# Patient Record
Sex: Female | Born: 1970 | Race: Black or African American | Hispanic: No | Marital: Married | Smoking: Former smoker
Health system: Southern US, Community
[De-identification: ages and names within clinical notes are randomized; demographics above are authoritative.]

## PROBLEM LIST (undated history)

## (undated) DIAGNOSIS — R519 Headache, unspecified: Secondary | ICD-10-CM

## (undated) DIAGNOSIS — I10 Essential (primary) hypertension: Secondary | ICD-10-CM

## (undated) DIAGNOSIS — J45909 Unspecified asthma, uncomplicated: Secondary | ICD-10-CM

## (undated) HISTORY — PX: MOUTH SURGERY: SHX715

## (undated) HISTORY — PX: COLONOSCOPY: SHX174

---

## 1998-03-14 ENCOUNTER — Other Ambulatory Visit: Admission: RE | Admit: 1998-03-14 | Discharge: 1998-03-14 | Payer: Self-pay | Admitting: Obstetrics

## 2000-04-23 ENCOUNTER — Encounter: Admission: RE | Admit: 2000-04-23 | Discharge: 2000-04-23 | Payer: Self-pay | Admitting: Family Medicine

## 2000-04-23 ENCOUNTER — Encounter: Payer: Self-pay | Admitting: Family Medicine

## 2001-11-16 ENCOUNTER — Inpatient Hospital Stay (HOSPITAL_COMMUNITY): Admission: AD | Admit: 2001-11-16 | Discharge: 2001-11-16 | Payer: Self-pay | Admitting: Obstetrics and Gynecology

## 2005-01-29 ENCOUNTER — Other Ambulatory Visit: Admission: RE | Admit: 2005-01-29 | Discharge: 2005-01-29 | Payer: Self-pay | Admitting: Obstetrics and Gynecology

## 2008-04-05 ENCOUNTER — Other Ambulatory Visit: Admission: RE | Admit: 2008-04-05 | Discharge: 2008-04-05 | Payer: Self-pay | Admitting: Family Medicine

## 2011-01-01 ENCOUNTER — Other Ambulatory Visit (HOSPITAL_COMMUNITY)
Admission: RE | Admit: 2011-01-01 | Discharge: 2011-01-01 | Disposition: A | Discharge status: Home / Self Care | Payer: 59 | Source: Ambulatory Visit | Attending: Family Medicine | Admitting: Family Medicine

## 2011-01-01 ENCOUNTER — Other Ambulatory Visit: Payer: Self-pay | Admitting: Family Medicine

## 2011-01-01 DIAGNOSIS — Z124 Encounter for screening for malignant neoplasm of cervix: Secondary | ICD-10-CM | POA: Insufficient documentation

## 2011-11-28 ENCOUNTER — Emergency Department (HOSPITAL_COMMUNITY)
Admission: EM | Admit: 2011-11-28 | Discharge: 2011-11-28 | Disposition: A | Discharge status: Home / Self Care | Payer: 59 | Attending: Emergency Medicine | Admitting: Emergency Medicine

## 2011-11-28 ENCOUNTER — Encounter (HOSPITAL_COMMUNITY): Payer: Self-pay | Admitting: Physical Medicine and Rehabilitation

## 2011-11-28 ENCOUNTER — Other Ambulatory Visit: Payer: Self-pay

## 2011-11-28 DIAGNOSIS — R0602 Shortness of breath: Secondary | ICD-10-CM | POA: Insufficient documentation

## 2011-11-28 DIAGNOSIS — R091 Pleurisy: Secondary | ICD-10-CM

## 2011-11-28 DIAGNOSIS — Z87891 Personal history of nicotine dependence: Secondary | ICD-10-CM | POA: Insufficient documentation

## 2011-11-28 LAB — COMPREHENSIVE METABOLIC PANEL
ALT: 7 U/L (ref 0–35)
AST: 17 U/L (ref 0–37)
Albumin: 3.9 g/dL (ref 3.5–5.2)
Chloride: 102 mEq/L (ref 96–112)
Creatinine, Ser: 0.86 mg/dL (ref 0.50–1.10)
Potassium: 3.7 mEq/L (ref 3.5–5.1)
Sodium: 137 mEq/L (ref 135–145)
Total Bilirubin: 0.3 mg/dL (ref 0.3–1.2)

## 2011-11-28 LAB — CBC WITH DIFFERENTIAL/PLATELET
Basophils Absolute: 0 10*3/uL (ref 0.0–0.1)
Basophils Relative: 1 % (ref 0–1)
Eosinophils Absolute: 0.1 10*3/uL (ref 0.0–0.7)
Eosinophils Relative: 2 % (ref 0–5)
HCT: 39.8 % (ref 36.0–46.0)
Hemoglobin: 13.5 g/dL (ref 12.0–15.0)
Lymphocytes Relative: 53 % — ABNORMAL HIGH (ref 12–46)
Lymphs Abs: 2.1 10*3/uL (ref 0.7–4.0)
MCH: 29.2 pg (ref 26.0–34.0)
MCHC: 33.9 g/dL (ref 30.0–36.0)
MCV: 86 fL (ref 78.0–100.0)
Monocytes Absolute: 0.3 10*3/uL (ref 0.1–1.0)
Monocytes Relative: 8 % (ref 3–12)
Neutro Abs: 1.4 10*3/uL — ABNORMAL LOW (ref 1.7–7.7)
Neutrophils Relative %: 37 % — ABNORMAL LOW (ref 43–77)
Platelets: 226 10*3/uL (ref 150–400)
RBC: 4.63 MIL/uL (ref 3.87–5.11)
RDW: 13.2 % (ref 11.5–15.5)
WBC: 4 10*3/uL (ref 4.0–10.5)

## 2011-11-28 LAB — POCT I-STAT TROPONIN I

## 2011-11-28 MED ORDER — HYDROCODONE-ACETAMINOPHEN 5-325 MG PO TABS: 1.0000 | ORAL_TABLET | Freq: Four times a day (QID) | ORAL | Status: DC | PRN

## 2011-11-28 MED ORDER — OMEPRAZOLE 20 MG PO CPDR: 40.0000 mg | DELAYED_RELEASE_CAPSULE | Freq: Every day | ORAL | Status: DC

## 2011-11-28 MED ORDER — ASPIRIN 81 MG PO CHEW
324.0000 mg | CHEWABLE_TABLET | Freq: Once | ORAL | Status: AC
  Administered 2011-11-28: 324 mg via ORAL
  Filled 2011-11-28: qty 4

## 2011-11-28 NOTE — ED Notes (Signed)
Pt presents to department from [facility] PCP for evaluation of L sided chest pain radiating to shoulder and back. 7/10 constant pain at the time. Also states SOB. Pt is conscious alert and oriented x4. Respirations unlabored. Skin warm and dry. No signs of acute distress at present.

## 2011-11-28 NOTE — ED Notes (Signed)
Patient resting with NAD at this time.  

## 2011-11-28 NOTE — ED Notes (Signed)
Pt c/o 9/10 cp over left breast sharp and dull intermittently radiating to back left shoulder blade. Pt states she is sob, weak. Denies blurred vision nor dizziness. Pt A&Ox4, ambulatory.

## 2011-11-28 NOTE — ED Provider Notes (Signed)
History     CSN: [id number] Arrival date & time 11/28/11  1242 First MD Initiated Contact with Patient 11/28/11 1337      Chief Complaint  Patient presents with  . Chest Pain  . Shortness of Breath    Patient is a 41 y.o. female presenting with chest pain and shortness of breath. The history is provided by the patient.  Chest Pain The chest pain began 6 - 12 hours ago (started at 8 am, left side). Chest pain occurs constantly (waxes and wanes). The chest pain is worsening. The pain is associated with breathing. The severity of the pain is moderate. The quality of the pain is described as sharp and dull. The pain radiates to the upper back. Chest pain is worsened by deep breathing. Primary symptoms include shortness of breath. Pertinent negatives for primary symptoms include no cough, no nausea and no vomiting.  Pertinent negatives for past medical history include no DVT, no MI and no PE.  Pertinent negatives for family medical history include: no CAD in family, no early MI in family and no PE in family.    Shortness of Breath  Associated symptoms include chest pain and shortness of breath. Pertinent negatives include no cough.  No recent trips or travel.  She does not feel short of breath at rest. She has tried antacids without relief.  No recent uri.  PCP saw her in the office and pt had an xray and EKG.  Sent to the ED for lab testing, further workup.  No past medical history on file.  No past surgical history on file.  No family history on file.  History  Substance Use Topics  . Smoking status: Former Smoker    Types: Cigarettes  . Smokeless tobacco: Not on file  . Alcohol Use: No    OB History    Grav Para Term Preterm Abortions TAB SAB Ect Mult Living                  Review of Systems  Respiratory: Positive for shortness of breath. Negative for cough.   Cardiovascular: Positive for chest pain.  Gastrointestinal: Negative for nausea and vomiting.  All other  systems reviewed and are negative.    Allergies  Review of patient's allergies indicates no known allergies.  Home Medications   Current Outpatient Rx  Name Route Sig Dispense Refill  . EUCERIN EX CREA Topical Apply 1 application topically as needed. For eczema      BP 162/98  Pulse 70  Temp 98.6 F (37 C) (Oral)  Resp 16  SpO2 100%  Physical Exam  Nursing note and vitals reviewed. Constitutional: She appears well-developed and well-nourished. No distress.  HENT:  Head: Normocephalic and atraumatic.  Right Ear: External ear normal.  Left Ear: External ear normal.  Eyes: Conjunctivae normal are normal. Right eye exhibits no discharge. Left eye exhibits no discharge. No scleral icterus.  Neck: Neck supple. No tracheal deviation present.  Cardiovascular: Normal rate, regular rhythm and intact distal pulses.   Pulmonary/Chest: Effort normal and breath sounds normal. No stridor. No respiratory distress. She has no wheezes. She has no rales.  Abdominal: Soft. Bowel sounds are normal. She exhibits no distension. There is no tenderness. There is no rebound and no guarding.  Musculoskeletal: She exhibits no edema and no tenderness.  Neurological: She is alert. She has normal strength. No sensory deficit. Cranial nerve deficit:  no gross defecits noted. She exhibits normal muscle tone. She displays no seizure  activity. Coordination normal.  Skin: Skin is warm and dry. No rash noted.  Psychiatric: She has a normal mood and affect.    ED Course  Procedures (including critical care time)  Rate: 69  Rhythm: normal sinus rhythm  QRS Axis: normal  Intervals: normal  ST/T Wave abnormalities: normal  Conduction Disutrbances:none  Narrative Interpretation: no acute st t changes  Old EKG Reviewed: none available  Labs Reviewed  CBC WITH DIFFERENTIAL - Abnormal; Notable for the following:    Neutrophils Relative 37 (*)     Neutro Abs 1.4 (*)     Lymphocytes Relative 53 (*)     All  other components within normal limits  COMPREHENSIVE METABOLIC PANEL - Abnormal; Notable for the following:    GFR calc non Af Amer 82 (*)     All other components within normal limits  POCT I-STAT TROPONIN I  D-DIMER, QUANTITATIVE   No results found.   1. Pleurisy       MDM  Patient's symptoms are atypical for acute coronary syndrome. Pain is sharp and pleuritic. She is low risk for pulmonary embolism by Wells criteria. Her d-dimer is negative. I doubt pulmonary embolism. Patient will be discharged home with treatment for pleurisy.        Celene Kras, MD 11/28/11 [id number]

## 2011-11-28 NOTE — ED Notes (Signed)
Pt states she had chest x-ray this morning, order cancelled.

## 2012-01-10 ENCOUNTER — Other Ambulatory Visit: Payer: Self-pay | Admitting: Obstetrics & Gynecology

## 2012-05-27 ENCOUNTER — Other Ambulatory Visit: Payer: Self-pay | Admitting: Obstetrics and Gynecology

## 2012-05-27 ENCOUNTER — Other Ambulatory Visit: Payer: Self-pay

## 2012-05-27 DIAGNOSIS — R928 Other abnormal and inconclusive findings on diagnostic imaging of breast: Secondary | ICD-10-CM

## 2012-06-04 ENCOUNTER — Other Ambulatory Visit: Payer: Self-pay | Admitting: Family Medicine

## 2012-06-04 DIAGNOSIS — R928 Other abnormal and inconclusive findings on diagnostic imaging of breast: Secondary | ICD-10-CM

## 2012-06-11 ENCOUNTER — Ambulatory Visit
Admission: RE | Admit: 2012-06-11 | Discharge: 2012-06-11 | Disposition: A | Discharge status: Home / Self Care | Payer: 59 | Source: Ambulatory Visit | Attending: Obstetrics and Gynecology | Admitting: Obstetrics and Gynecology

## 2012-06-11 ENCOUNTER — Ambulatory Visit
Admission: RE | Admit: 2012-06-11 | Discharge: 2012-06-11 | Disposition: A | Discharge status: Home / Self Care | Payer: 59 | Source: Ambulatory Visit | Attending: Family Medicine | Admitting: Family Medicine

## 2012-06-11 ENCOUNTER — Other Ambulatory Visit: Payer: Self-pay | Admitting: Family Medicine

## 2012-06-11 DIAGNOSIS — R928 Other abnormal and inconclusive findings on diagnostic imaging of breast: Secondary | ICD-10-CM

## 2013-02-01 ENCOUNTER — Other Ambulatory Visit: Payer: Self-pay | Admitting: Family Medicine

## 2013-02-01 ENCOUNTER — Other Ambulatory Visit (HOSPITAL_COMMUNITY)
Admission: RE | Admit: 2013-02-01 | Discharge: 2013-02-01 | Disposition: A | Discharge status: Home / Self Care | Payer: 59 | Source: Ambulatory Visit | Attending: Family Medicine | Admitting: Family Medicine

## 2013-02-01 DIAGNOSIS — Z01419 Encounter for gynecological examination (general) (routine) without abnormal findings: Secondary | ICD-10-CM | POA: Insufficient documentation

## 2013-05-11 ENCOUNTER — Other Ambulatory Visit: Payer: Self-pay

## 2013-05-11 DIAGNOSIS — Z1231 Encounter for screening mammogram for malignant neoplasm of breast: Secondary | ICD-10-CM

## 2013-06-18 ENCOUNTER — Ambulatory Visit
Admission: RE | Admit: 2013-06-18 | Discharge: 2013-06-18 | Disposition: A | Discharge status: Home / Self Care | Payer: 59 | Source: Ambulatory Visit

## 2013-06-18 ENCOUNTER — Encounter (INDEPENDENT_AMBULATORY_CARE_PROVIDER_SITE_OTHER): Payer: Self-pay

## 2013-06-18 DIAGNOSIS — Z1231 Encounter for screening mammogram for malignant neoplasm of breast: Secondary | ICD-10-CM

## 2014-08-02 ENCOUNTER — Other Ambulatory Visit: Payer: Self-pay

## 2014-08-02 ENCOUNTER — Other Ambulatory Visit: Payer: Self-pay | Admitting: Obstetrics and Gynecology

## 2014-08-02 DIAGNOSIS — Z1231 Encounter for screening mammogram for malignant neoplasm of breast: Secondary | ICD-10-CM

## 2014-08-09 ENCOUNTER — Ambulatory Visit
Admission: RE | Admit: 2014-08-09 | Discharge: 2014-08-09 | Disposition: A | Discharge status: Home / Self Care | Payer: 59 | Source: Ambulatory Visit

## 2014-08-09 DIAGNOSIS — Z1231 Encounter for screening mammogram for malignant neoplasm of breast: Secondary | ICD-10-CM

## 2015-09-13 ENCOUNTER — Other Ambulatory Visit: Payer: Self-pay | Admitting: Family Medicine

## 2015-09-13 DIAGNOSIS — Z1231 Encounter for screening mammogram for malignant neoplasm of breast: Secondary | ICD-10-CM

## 2015-09-21 ENCOUNTER — Ambulatory Visit
Admission: RE | Admit: 2015-09-21 | Discharge: 2015-09-21 | Disposition: A | Discharge status: Home / Self Care | Payer: 59 | Source: Ambulatory Visit | Attending: Family Medicine | Admitting: Family Medicine

## 2015-09-21 DIAGNOSIS — Z1231 Encounter for screening mammogram for malignant neoplasm of breast: Secondary | ICD-10-CM

## 2016-02-19 DIAGNOSIS — H109 Unspecified conjunctivitis: Secondary | ICD-10-CM | POA: Diagnosis not present

## 2016-02-19 DIAGNOSIS — M7711 Lateral epicondylitis, right elbow: Secondary | ICD-10-CM | POA: Diagnosis not present

## 2016-04-22 DIAGNOSIS — R21 Rash and other nonspecific skin eruption: Secondary | ICD-10-CM | POA: Diagnosis not present

## 2016-12-09 DIAGNOSIS — L309 Dermatitis, unspecified: Secondary | ICD-10-CM | POA: Diagnosis not present

## 2016-12-09 DIAGNOSIS — J310 Chronic rhinitis: Secondary | ICD-10-CM | POA: Diagnosis not present

## 2016-12-25 ENCOUNTER — Other Ambulatory Visit: Payer: Self-pay | Admitting: Family Medicine

## 2016-12-25 ENCOUNTER — Other Ambulatory Visit (HOSPITAL_COMMUNITY)
Admission: RE | Admit: 2016-12-25 | Discharge: 2016-12-25 | Disposition: A | Discharge status: Home / Self Care | Payer: 59 | Source: Ambulatory Visit | Attending: Family Medicine | Admitting: Family Medicine

## 2016-12-25 DIAGNOSIS — Z0000 Encounter for general adult medical examination without abnormal findings: Secondary | ICD-10-CM | POA: Diagnosis not present

## 2016-12-25 DIAGNOSIS — E559 Vitamin D deficiency, unspecified: Secondary | ICD-10-CM | POA: Diagnosis not present

## 2016-12-25 DIAGNOSIS — Z124 Encounter for screening for malignant neoplasm of cervix: Secondary | ICD-10-CM | POA: Diagnosis not present

## 2016-12-27 LAB — CYTOLOGY - PAP
ADEQUACY: ABSENT
DIAGNOSIS: NEGATIVE
HPV (WINDOPATH): NOT DETECTED

## 2017-03-01 DIAGNOSIS — J209 Acute bronchitis, unspecified: Secondary | ICD-10-CM | POA: Diagnosis not present

## 2017-05-12 DIAGNOSIS — M79672 Pain in left foot: Secondary | ICD-10-CM | POA: Diagnosis not present

## 2017-09-15 DIAGNOSIS — M79642 Pain in left hand: Secondary | ICD-10-CM | POA: Diagnosis not present

## 2017-09-15 DIAGNOSIS — M546 Pain in thoracic spine: Secondary | ICD-10-CM | POA: Diagnosis not present

## 2017-12-29 DIAGNOSIS — Z0000 Encounter for general adult medical examination without abnormal findings: Secondary | ICD-10-CM | POA: Diagnosis not present

## 2018-02-20 ENCOUNTER — Other Ambulatory Visit: Payer: Self-pay | Admitting: Family Medicine

## 2018-02-20 DIAGNOSIS — Z1231 Encounter for screening mammogram for malignant neoplasm of breast: Secondary | ICD-10-CM

## 2018-03-23 ENCOUNTER — Ambulatory Visit
Admission: RE | Admit: 2018-03-23 | Discharge: 2018-03-23 | Disposition: A | Discharge status: Home / Self Care | Payer: PRIVATE HEALTH INSURANCE | Source: Ambulatory Visit | Attending: Family Medicine | Admitting: Family Medicine

## 2018-03-23 DIAGNOSIS — Z1231 Encounter for screening mammogram for malignant neoplasm of breast: Secondary | ICD-10-CM

## 2020-02-06 ENCOUNTER — Other Ambulatory Visit: Payer: PRIVATE HEALTH INSURANCE

## 2020-02-06 ENCOUNTER — Other Ambulatory Visit: Payer: Self-pay

## 2020-02-06 DIAGNOSIS — Z20822 Contact with and (suspected) exposure to covid-19: Secondary | ICD-10-CM

## 2020-02-08 LAB — SARS-COV-2, NAA 2 DAY TAT

## 2020-02-08 LAB — NOVEL CORONAVIRUS, NAA: SARS-CoV-2, NAA: DETECTED — AB

## 2020-03-08 ENCOUNTER — Other Ambulatory Visit: Payer: Self-pay | Admitting: Family Medicine

## 2020-03-08 DIAGNOSIS — Z1231 Encounter for screening mammogram for malignant neoplasm of breast: Secondary | ICD-10-CM

## 2020-04-27 ENCOUNTER — Other Ambulatory Visit: Payer: Self-pay

## 2020-04-27 ENCOUNTER — Ambulatory Visit
Admission: RE | Admit: 2020-04-27 | Discharge: 2020-04-27 | Disposition: A | Discharge status: Home / Self Care | Payer: PRIVATE HEALTH INSURANCE | Source: Ambulatory Visit | Attending: Family Medicine | Admitting: Family Medicine

## 2020-04-27 DIAGNOSIS — Z1231 Encounter for screening mammogram for malignant neoplasm of breast: Secondary | ICD-10-CM

## 2021-01-19 ENCOUNTER — Other Ambulatory Visit: Payer: Self-pay

## 2021-01-19 ENCOUNTER — Emergency Department (HOSPITAL_COMMUNITY): Payer: PRIVATE HEALTH INSURANCE

## 2021-01-19 ENCOUNTER — Emergency Department (HOSPITAL_COMMUNITY)
Admission: EM | Admit: 2021-01-19 | Discharge: 2021-01-19 | Disposition: A | Discharge status: Home / Self Care | Payer: PRIVATE HEALTH INSURANCE | Attending: Emergency Medicine | Admitting: Emergency Medicine

## 2021-01-19 DIAGNOSIS — M25562 Pain in left knee: Secondary | ICD-10-CM | POA: Insufficient documentation

## 2021-01-19 DIAGNOSIS — M25561 Pain in right knee: Secondary | ICD-10-CM | POA: Insufficient documentation

## 2021-01-19 DIAGNOSIS — Z5321 Procedure and treatment not carried out due to patient leaving prior to being seen by health care provider: Secondary | ICD-10-CM | POA: Insufficient documentation

## 2021-01-19 DIAGNOSIS — R0789 Other chest pain: Secondary | ICD-10-CM | POA: Diagnosis not present
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
## 2021-01-20 ENCOUNTER — Emergency Department (HOSPITAL_BASED_OUTPATIENT_CLINIC_OR_DEPARTMENT_OTHER)
Admission: EM | Admit: 2021-01-20 | Discharge: 2021-01-20 | Disposition: A | Discharge status: Home / Self Care | Payer: PRIVATE HEALTH INSURANCE | Attending: Emergency Medicine | Admitting: Emergency Medicine

## 2021-01-20 ENCOUNTER — Encounter (HOSPITAL_BASED_OUTPATIENT_CLINIC_OR_DEPARTMENT_OTHER): Payer: Self-pay | Admitting: Emergency Medicine

## 2021-01-20 DIAGNOSIS — M542 Cervicalgia: Secondary | ICD-10-CM | POA: Diagnosis not present

## 2021-01-20 DIAGNOSIS — Z87891 Personal history of nicotine dependence: Secondary | ICD-10-CM | POA: Insufficient documentation

## 2021-01-20 DIAGNOSIS — M545 Low back pain, unspecified: Secondary | ICD-10-CM | POA: Insufficient documentation

## 2021-01-20 DIAGNOSIS — Z79899 Other long term (current) drug therapy: Secondary | ICD-10-CM | POA: Insufficient documentation

## 2021-01-20 DIAGNOSIS — Y92410 Unspecified street and highway as the place of occurrence of the external cause: Secondary | ICD-10-CM | POA: Diagnosis not present

## 2021-01-20 HISTORY — DX: Essential (primary) hypertension: I10

## 2021-01-20 MED ORDER — IBUPROFEN 800 MG PO TABS
800.0000 mg | ORAL_TABLET | Freq: Once | ORAL | Status: AC
  Administered 2021-01-20: 09:00:00 800 mg via ORAL
  Filled 2021-01-20: qty 1

## 2021-01-20 NOTE — ED Notes (Signed)
Patient verbalizes understanding of discharge instructions. Opportunity for questioning and answers were provided. Patient discharged from ED.  °
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
## 2021-04-17 ENCOUNTER — Other Ambulatory Visit: Payer: Self-pay | Admitting: Family Medicine

## 2021-04-17 DIAGNOSIS — Z1231 Encounter for screening mammogram for malignant neoplasm of breast: Secondary | ICD-10-CM

## 2021-05-04 ENCOUNTER — Ambulatory Visit
Admission: RE | Admit: 2021-05-04 | Discharge: 2021-05-04 | Disposition: A | Discharge status: Home / Self Care | Payer: PRIVATE HEALTH INSURANCE | Source: Ambulatory Visit | Attending: Family Medicine | Admitting: Family Medicine

## 2021-05-04 DIAGNOSIS — Z1231 Encounter for screening mammogram for malignant neoplasm of breast: Secondary | ICD-10-CM

## 2021-12-20 ENCOUNTER — Other Ambulatory Visit: Payer: Self-pay

## 2021-12-20 ENCOUNTER — Emergency Department (HOSPITAL_BASED_OUTPATIENT_CLINIC_OR_DEPARTMENT_OTHER): Payer: Managed Care, Other (non HMO)

## 2021-12-20 ENCOUNTER — Emergency Department (HOSPITAL_BASED_OUTPATIENT_CLINIC_OR_DEPARTMENT_OTHER)
Admission: EM | Admit: 2021-12-20 | Discharge: 2021-12-20 | Disposition: A | Discharge status: Home / Self Care | Payer: Managed Care, Other (non HMO) | Attending: Emergency Medicine | Admitting: Emergency Medicine

## 2021-12-20 ENCOUNTER — Encounter (HOSPITAL_BASED_OUTPATIENT_CLINIC_OR_DEPARTMENT_OTHER): Payer: Self-pay | Admitting: Emergency Medicine

## 2021-12-20 DIAGNOSIS — Y9289 Other specified places as the place of occurrence of the external cause: Secondary | ICD-10-CM | POA: Insufficient documentation

## 2021-12-20 DIAGNOSIS — M25571 Pain in right ankle and joints of right foot: Secondary | ICD-10-CM | POA: Diagnosis present

## 2021-12-20 DIAGNOSIS — X501XXA Overexertion from prolonged static or awkward postures, initial encounter: Secondary | ICD-10-CM | POA: Diagnosis not present

## 2021-12-20 DIAGNOSIS — S8264XA Nondisplaced fracture of lateral malleolus of right fibula, initial encounter for closed fracture: Secondary | ICD-10-CM | POA: Diagnosis not present

## 2021-12-20 MED ORDER — IBUPROFEN 800 MG PO TABS: 800.0000 mg | ORAL_TABLET | Freq: Three times a day (TID) | ORAL | 0 refills | Status: DC

## 2021-12-20 MED ORDER — OXYCODONE-ACETAMINOPHEN 5-325 MG PO TABS: 1.0000 | ORAL_TABLET | Freq: Four times a day (QID) | ORAL | 0 refills | Status: DC | PRN

## 2021-12-20 MED ORDER — IBUPROFEN 800 MG PO TABS
800.0000 mg | ORAL_TABLET | Freq: Once | ORAL | Status: AC
  Administered 2021-12-20: 800 mg via ORAL
  Filled 2021-12-20: qty 1

## 2021-12-20 NOTE — ED Notes (Signed)
Pt c/o pain 9/10 in R ankle. Pulses WNL. Skin intact. Ice applied.

## 2021-12-20 NOTE — Discharge Instructions (Signed)
You were seen in the emergency department today after a trip and fall.  You did fracture your ankle/fibula.  We placed you in a boot and crutches.  You are to be nonweightbearing until you follow-up with orthopedic.  I have also prescribed you some extra strength ibuprofen that you can use 3 times a day for pain relief.  When you are resting please elevate your right ankle and use ice multiple times a day for 15 to 20 minutes at a time.  I have referred you to the orthopedic at [facility].  Please call Monday or Tuesday to ensure your follow-up appointment.  Please return for significantly worsening pain.

## 2021-12-20 NOTE — ED Triage Notes (Signed)
Fell down steps, twisted right ankle, large lump on right ankle,painful.

## 2021-12-20 NOTE — ED Provider Notes (Signed)
[facility] EMERGENCY DEPT Provider Note   CSN: [id number] Arrival date & time: 12/20/21  4627     History  Chief Complaint  Patient presents with   Ankle Pain    Elizabeth Perry is a 51 y.o. female. With past medical history of hypertension who presents to the emergency department with fall.  States she was leaving her storage room that had a ramp when she slipped, rolling her right ankle and falling.  She denies striking her head or loss of consciousness.  She states immediately afterwards she had pain and swelling to the right ankle.  Denies other pain or injuries.  HPI     Home Medications Prior to Admission medications   Medication Sig Start Date End Date Taking? Authorizing Provider  ibuprofen (ADVIL) 800 MG tablet Take 1 tablet (800 mg total) by mouth 3 (three) times daily. 12/20/21  Yes Cristopher Peru, PA-C  amlodipine-atorvastatin (CADUET) 2.5-40 MG tablet Take 1 tablet by mouth daily.    [provider]  HYDROcodone-acetaminophen (NORCO) 5-325 MG per tablet Take 1-2 tablets by mouth every 6 (six) hours as needed for pain. 11/28/11   Linwood Dibbles, MD  metoprolol succinate (TOPROL-XL) 25 MG 24 hr tablet Take 25 mg by mouth daily.    [provider]  omeprazole (PRILOSEC) 20 MG capsule Take 2 capsules (40 mg total) by mouth daily. 11/28/11   Linwood Dibbles, MD  Skin Protectants, Misc. (EUCERIN) cream Apply 1 application topically as needed. For eczema    [provider]      Allergies    Patient has no known allergies.    Review of Systems   Review of Systems  Musculoskeletal:  Positive for arthralgias, gait problem and joint swelling.  All other systems reviewed and are negative.   Physical Exam Updated Vital Signs BP 122/79   Pulse 63   Temp 97.6 F (36.4 C) (Oral)   Resp 16   SpO2 100%  Physical Exam Vitals and nursing note reviewed.  HENT:     Head: Normocephalic and atraumatic.  Eyes:     General: No  scleral icterus. Pulmonary:     Effort: Pulmonary effort is normal. No respiratory distress.  Musculoskeletal:     Comments: Lateral right ankle with swelling, bruising, tenderness to palpation.  DP pulse 2+.  Decreased range of motion of the ankle joint to flexion and extension.  She is able to move all toes.  Sensation is intact.  Skin:    Findings: No rash.  Neurological:     General: No focal deficit present.     Mental Status: She is alert.  Psychiatric:        Mood and Affect: Mood normal.        Behavior: Behavior normal.        Thought Content: Thought content normal.        Judgment: Judgment normal.     ED Results / Procedures / Treatments   Labs (all labs ordered are listed, but only abnormal results are displayed) Labs Reviewed - No data to display  EKG None  Radiology DG Ankle Right Port  Result Date: 12/20/2021 CLINICAL DATA:  Ankle injury with pain. EXAM: PORTABLE RIGHT ANKLE - 2 VIEW COMPARISON:  None Available. FINDINGS: Two-view exam shows a comminuted distal fibula fracture at the level of the ankle mortise. There may be some widening of the lateral tibiotalar joint. Soft tissue swelling evident. IMPRESSION: Comminuted distal fibula fracture with possible widening of the lateral  tibiotalar joint. No associated fracture of the distal tibia evident on this two-view exam. Electronically Signed   By: Kennith Center M.D.   On: 12/20/2021 09:06    Procedures Procedures    Medications Ordered in ED Medications  ibuprofen (ADVIL) tablet 800 mg (800 mg Oral Given 12/20/21 3825)    ED Course/ Medical Decision Making/ A&P                           Medical Decision Making Amount and/or Complexity of Data Reviewed Radiology: ordered.  Risk Prescription drug management.  Initial Impression and Ddx 51 year old female who presents to the emergency department with right ankle pain.  Lateral right ankle swelling.  Neurovascularly intact Patient PMH that increases  complexity of ED encounter:  hypertension  Interpretation of Diagnostics I independent reviewed and interpreted the labs as followed: Not indicated  - I independently visualized the following imaging with scope of interpretation limited to determining acute life threatening conditions related to emergency care: Plain film of the right ankle, which revealed comminuted distal fibular fracture with possible widening of the lateral tibiotalar joint.  Patient Reassessment and Ultimate Disposition/Management Given ice and ibuprofen.  X-ray with fracture.  Placing in a cam boot with crutches to be nonweightbearing. Will discharge with orthopedic follow-up.  Instructed to be nonweightbearing until she is evaluated by Ortho.  Prescribed her extra strength ibuprofen for her to use.  Discussed RICE therapy.  Discussed return precautions.  She verbalized understanding.  Otherwise feel she is safe for discharge Patient management required discussion with the following services or consulting groups:  None  Complexity of Problems Addressed Acute complicated illness or Injury  Additional Data Reviewed and Analyzed Further history obtained from: Further history from spouse/family member  Patient Encounter Risk Assessment Prescriptions  Final Clinical Impression(s) / ED Diagnoses Final diagnoses:  Closed nondisplaced fracture of lateral malleolus of right fibula, initial encounter    Rx / DC Orders ED Discharge Orders          Ordered    ibuprofen (ADVIL) 800 MG tablet  3 times daily        12/20/21 0930    Ambulatory referral to Orthopedic Surgery        12/20/21 0931              Cristopher Peru, PA-C 12/20/21 0539    Derwood Kaplan, MD 12/20/21 1348

## 2021-12-20 NOTE — ED Notes (Signed)
Pt given discharge instructions and reviewed prescriptions. Opportunities given for questions. Pt verbalizes understanding. Ketara Cavness R, RN 

## 2021-12-24 ENCOUNTER — Ambulatory Visit (INDEPENDENT_AMBULATORY_CARE_PROVIDER_SITE_OTHER): Payer: Managed Care, Other (non HMO)

## 2021-12-24 ENCOUNTER — Ambulatory Visit (INDEPENDENT_AMBULATORY_CARE_PROVIDER_SITE_OTHER): Payer: Managed Care, Other (non HMO) | Admitting: Orthopedic Surgery

## 2021-12-24 ENCOUNTER — Encounter: Payer: Self-pay | Admitting: Orthopedic Surgery

## 2021-12-24 DIAGNOSIS — M25571 Pain in right ankle and joints of right foot: Secondary | ICD-10-CM | POA: Diagnosis not present

## 2021-12-24 DIAGNOSIS — S82891A Other fracture of right lower leg, initial encounter for closed fracture: Secondary | ICD-10-CM

## 2021-12-24 NOTE — Progress Notes (Signed)
Office Visit Note   Patient: Elizabeth Perry           Date of Birth: 04/01/1970           MRN: [id number] Visit Date: 12/24/2021              Requested by: Daisy Floro, MD [address] [place],  [place] [address] PCP: Daisy Floro, MD  Chief Complaint  Patient presents with   Right Ankle - Fracture      HPI: Patient is a 51 year old woman who sustained a Weber B right fibular fracture closed when she slipped in a storage unit on 12/20/2021.  Patient went to med center at [facility] radiographs were obtained she is placed in a fracture boot and is seen today for initial evaluation.  Assessment & Plan: Visit Diagnoses:  1. Pain in right ankle and joints of right foot   2. Closed fracture of right ankle, initial encounter     Plan: With the displaced ankle fracture have recommended proceeding with open reduction internal fixation.  Risk and benefits were discussed including infection neurovascular injury persistent pain DVT arthritis.  Patient states she understands wished to proceed at this time plan for outpatient surgery on Friday.  Follow-Up Instructions: Return in about 2 weeks (around 01/07/2022).   Ortho Exam  Patient is alert, oriented, no adenopathy, well-dressed, normal affect, normal respiratory effort. Examination patient has a palpable dorsalis pedis pulse.  The deltoid ligament is nontender to palpation the syndesmosis is not tender to palpation.  The proximal tibia and fibular are not tender to palpation.  Patient is tender to palpation laterally over the ankle.  Radiograph shows a displaced Weber B fibular fracture closed.  Imaging: XR Ankle Complete Right  Result Date: 12/24/2021 Three-view radiographs of the right ankle shows a displaced Weber B fibula fracture with lateral displacement of the talus and widened medial joint space.  No images are attached to the encounter.  Labs: No results found for: "HGBA1C", "ESRSEDRATE", "CRP",  "LABURIC", "REPTSTATUS", "GRAMSTAIN", "CULT", "LABORGA"   Lab Results  Component Value Date   ALBUMIN 3.9 11/28/2011    No results found for: "MG" No results found for: "VD25OH"  No results found for: "PREALBUMIN"    Latest Ref Rng & Units 11/28/2011   12:52 PM  CBC EXTENDED  WBC 4.0 - 10.5 K/uL 4.0   RBC 3.87 - 5.11 MIL/uL 4.63   Hemoglobin 12.0 - 15.0 g/dL 56.8   HCT 12.7 - 51.7 % 39.8   Platelets 150 - 400 K/uL 226   NEUT# 1.7 - 7.7 K/uL 1.4   Lymph# 0.7 - 4.0 K/uL 2.1      There is no height or weight on file to calculate BMI.  Orders:  Orders Placed This Encounter  Procedures   XR Ankle Complete Right   No orders of the defined types were placed in this encounter.    Procedures: No procedures performed  Clinical Data: No additional findings.  ROS:  All other systems negative, except as noted in the HPI. Review of Systems  Objective: Vital Signs: There were no vitals taken for this visit.  Specialty Comments:  No specialty comments available.  PMFS History: There are no problems to display for this patient.  Past Medical History:  Diagnosis Date   Hypertension     History reviewed. No pertinent family history.  History reviewed. No pertinent surgical history. Social History   Occupational History   Not on file  Tobacco  Use   Smoking status: Former    Types: Cigarettes   Smokeless tobacco: Not on file  Substance and Sexual Activity   Alcohol use: No   Drug use: No   Sexual activity: Not on file

## 2021-12-27 ENCOUNTER — Other Ambulatory Visit: Payer: Self-pay

## 2021-12-27 ENCOUNTER — Encounter (HOSPITAL_COMMUNITY): Payer: Self-pay | Admitting: Orthopedic Surgery

## 2021-12-27 NOTE — Progress Notes (Signed)
Elizabeth Perry denies chest pain or shortness of breath.  Patient denies having any s/s of Covid in her household, also denies any known exposure to Covid.   Elizabeth Perry's PCP is Dr. Miguel Aschoff.

## 2021-12-28 ENCOUNTER — Encounter (HOSPITAL_COMMUNITY): Payer: Self-pay | Admitting: Orthopedic Surgery

## 2021-12-28 ENCOUNTER — Ambulatory Visit (HOSPITAL_BASED_OUTPATIENT_CLINIC_OR_DEPARTMENT_OTHER): Payer: Managed Care, Other (non HMO) | Admitting: General Practice

## 2021-12-28 ENCOUNTER — Ambulatory Visit (HOSPITAL_COMMUNITY)
Admission: RE | Admit: 2021-12-28 | Discharge: 2021-12-28 | Disposition: A | Discharge status: Home / Self Care | Payer: Managed Care, Other (non HMO) | Attending: Orthopedic Surgery | Admitting: Orthopedic Surgery

## 2021-12-28 ENCOUNTER — Ambulatory Visit (HOSPITAL_COMMUNITY): Payer: Managed Care, Other (non HMO) | Admitting: General Practice

## 2021-12-28 ENCOUNTER — Encounter (HOSPITAL_COMMUNITY)
Admission: RE | Disposition: A | Discharge status: Home / Self Care | Payer: Self-pay | Source: Home / Self Care | Attending: Orthopedic Surgery

## 2021-12-28 ENCOUNTER — Other Ambulatory Visit: Payer: Self-pay

## 2021-12-28 DIAGNOSIS — J45909 Unspecified asthma, uncomplicated: Secondary | ICD-10-CM | POA: Insufficient documentation

## 2021-12-28 DIAGNOSIS — X58XXXA Exposure to other specified factors, initial encounter: Secondary | ICD-10-CM | POA: Insufficient documentation

## 2021-12-28 DIAGNOSIS — S82891A Other fracture of right lower leg, initial encounter for closed fracture: Secondary | ICD-10-CM | POA: Diagnosis not present

## 2021-12-28 DIAGNOSIS — I10 Essential (primary) hypertension: Secondary | ICD-10-CM | POA: Diagnosis not present

## 2021-12-28 DIAGNOSIS — Z87891 Personal history of nicotine dependence: Secondary | ICD-10-CM | POA: Diagnosis not present

## 2021-12-28 HISTORY — DX: Unspecified asthma, uncomplicated: J45.909

## 2021-12-28 HISTORY — DX: Headache, unspecified: R51.9

## 2021-12-28 HISTORY — PX: ORIF ANKLE FRACTURE: SHX5408

## 2021-12-28 LAB — BASIC METABOLIC PANEL
Anion gap: 7 (ref 5–15)
BUN: 10 mg/dL (ref 6–20)
CO2: 26 mmol/L (ref 22–32)
Calcium: 9.2 mg/dL (ref 8.9–10.3)
Chloride: 105 mmol/L (ref 98–111)
Creatinine, Ser: 0.95 mg/dL (ref 0.44–1.00)
GFR, Estimated: >60 mL/min (ref >60)
Glucose, Bld: 95 mg/dL (ref 70–99)
Potassium: 3.9 mmol/L (ref 3.5–5.1)
Sodium: 138 mmol/L (ref 135–145)

## 2021-12-28 LAB — CBC
HCT: 40.8 % (ref 36.0–46.0)
Hemoglobin: 13.5 g/dL (ref 12.0–15.0)
MCH: 29.1 pg (ref 26.0–34.0)
MCHC: 33.1 g/dL (ref 30.0–36.0)
MCV: 87.9 fL (ref 80.0–100.0)
Platelets: 250 10*3/uL (ref 150–400)
RBC: 4.64 MIL/uL (ref 3.87–5.11)
RDW: 14 % (ref 11.5–15.5)
WBC: 6.3 10*3/uL (ref 4.0–10.5)
nRBC: 0 % (ref 0.0–0.2)

## 2021-12-28 LAB — SURGICAL PCR SCREEN
MRSA, PCR: NEGATIVE
Staphylococcus aureus: NEGATIVE

## 2021-12-28 SURGERY — OPEN REDUCTION INTERNAL FIXATION (ORIF) ANKLE FRACTURE
Anesthesia: Regional | Site: Ankle | Laterality: Right

## 2021-12-28 MED ORDER — PHENYLEPHRINE 80 MCG/ML (10ML) SYRINGE FOR IV PUSH (FOR BLOOD PRESSURE SUPPORT)
PREFILLED_SYRINGE | INTRAVENOUS | Status: AC
  Filled 2021-12-28: qty 10

## 2021-12-28 MED ORDER — ROPIVACAINE HCL 5 MG/ML IJ SOLN
INTRAMUSCULAR | Status: DC | PRN
  Administered 2021-12-28: 30 mL via PERINEURAL

## 2021-12-28 MED ORDER — ONDANSETRON HCL 4 MG/2ML IJ SOLN
INTRAMUSCULAR | Status: AC
  Filled 2021-12-28: qty 2

## 2021-12-28 MED ORDER — ONDANSETRON HCL 4 MG/2ML IJ SOLN
INTRAMUSCULAR | Status: DC | PRN
  Administered 2021-12-28: 4 mg via INTRAVENOUS

## 2021-12-28 MED ORDER — LIDOCAINE 2% (20 MG/ML) 5 ML SYRINGE
INTRAMUSCULAR | Status: DC | PRN
  Administered 2021-12-28: 20 mg via INTRAVENOUS

## 2021-12-28 MED ORDER — FENTANYL CITRATE (PF) 250 MCG/5ML IJ SOLN
INTRAMUSCULAR | Status: AC
  Filled 2021-12-28: qty 5

## 2021-12-28 MED ORDER — OXYCODONE-ACETAMINOPHEN 5-325 MG PO TABS: 1.0000 | ORAL_TABLET | ORAL | 0 refills | Status: DC | PRN

## 2021-12-28 MED ORDER — EPHEDRINE SULFATE-NACL 50-0.9 MG/10ML-% IV SOSY
PREFILLED_SYRINGE | INTRAVENOUS | Status: DC | PRN
  Administered 2021-12-28: 5 mg via INTRAVENOUS

## 2021-12-28 MED ORDER — PHENYLEPHRINE HCL-NACL 20-0.9 MG/250ML-% IV SOLN
INTRAVENOUS | Status: DC | PRN
  Administered 2021-12-28: 30 ug/min via INTRAVENOUS

## 2021-12-28 MED ORDER — FENTANYL CITRATE (PF) 100 MCG/2ML IJ SOLN: 50.0000 ug | Freq: Once | INTRAMUSCULAR | Status: AC

## 2021-12-28 MED ORDER — BUPIVACAINE HCL (PF) 0.25 % IJ SOLN
INTRAMUSCULAR | Status: DC | PRN
  Administered 2021-12-28: 15 mL via PERINEURAL

## 2021-12-28 MED ORDER — MIDAZOLAM HCL 2 MG/2ML IJ SOLN: 2.0000 mg | Freq: Once | INTRAMUSCULAR | Status: AC

## 2021-12-28 MED ORDER — PROPOFOL 10 MG/ML IV BOLUS
INTRAVENOUS | Status: AC
  Filled 2021-12-28: qty 20

## 2021-12-28 MED ORDER — FENTANYL CITRATE (PF) 100 MCG/2ML IJ SOLN
INTRAMUSCULAR | Status: AC
  Administered 2021-12-28: 50 ug via INTRAVENOUS
  Filled 2021-12-28: qty 2

## 2021-12-28 MED ORDER — FENTANYL CITRATE (PF) 250 MCG/5ML IJ SOLN
INTRAMUSCULAR | Status: DC | PRN
  Administered 2021-12-28: 50 ug via INTRAVENOUS

## 2021-12-28 MED ORDER — MIDAZOLAM HCL 2 MG/2ML IJ SOLN
INTRAMUSCULAR | Status: AC
  Administered 2021-12-28: 2 mg via INTRAVENOUS
  Filled 2021-12-28: qty 2

## 2021-12-28 MED ORDER — PROPOFOL 10 MG/ML IV BOLUS
INTRAVENOUS | Status: DC | PRN
  Administered 2021-12-28: 200 mg via INTRAVENOUS

## 2021-12-28 MED ORDER — LIDOCAINE 2% (20 MG/ML) 5 ML SYRINGE
INTRAMUSCULAR | Status: AC
  Filled 2021-12-28: qty 5

## 2021-12-28 MED ORDER — DEXAMETHASONE SODIUM PHOSPHATE 10 MG/ML IJ SOLN
INTRAMUSCULAR | Status: DC | PRN
  Administered 2021-12-28 (×2): 5 mg

## 2021-12-28 MED ORDER — ORAL CARE MOUTH RINSE: 15.0000 mL | Freq: Once | OROMUCOSAL | Status: AC

## 2021-12-28 MED ORDER — CEFAZOLIN SODIUM-DEXTROSE 2-4 GM/100ML-% IV SOLN
2.0000 g | INTRAVENOUS | Status: AC
  Administered 2021-12-28: 2 g via INTRAVENOUS
  Filled 2021-12-28: qty 100

## 2021-12-28 MED ORDER — FENTANYL CITRATE (PF) 100 MCG/2ML IJ SOLN: 25.0000 ug | INTRAMUSCULAR | Status: DC | PRN

## 2021-12-28 MED ORDER — LACTATED RINGERS IV SOLN: INTRAVENOUS | Status: DC

## 2021-12-28 MED ORDER — CHLORHEXIDINE GLUCONATE 0.12 % MT SOLN
15.0000 mL | Freq: Once | OROMUCOSAL | Status: AC
  Administered 2021-12-28: 15 mL via OROMUCOSAL
  Filled 2021-12-28: qty 15

## 2021-12-28 MED ORDER — DEXAMETHASONE SODIUM PHOSPHATE 10 MG/ML IJ SOLN
INTRAMUSCULAR | Status: AC
  Filled 2021-12-28: qty 1

## 2021-12-28 MED ORDER — ACETAMINOPHEN 500 MG PO TABS
1000.0000 mg | ORAL_TABLET | Freq: Once | ORAL | Status: AC
  Administered 2021-12-28: 1000 mg via ORAL
  Filled 2021-12-28: qty 2

## 2021-12-28 MED ORDER — DEXAMETHASONE SODIUM PHOSPHATE 10 MG/ML IJ SOLN
INTRAMUSCULAR | Status: DC | PRN
  Administered 2021-12-28: 10 mg via INTRAVENOUS

## 2021-12-28 MED ORDER — 0.9 % SODIUM CHLORIDE (POUR BTL) OPTIME
TOPICAL | Status: DC | PRN
  Administered 2021-12-28: 1000 mL

## 2021-12-28 MED ORDER — PHENYLEPHRINE 80 MCG/ML (10ML) SYRINGE FOR IV PUSH (FOR BLOOD PRESSURE SUPPORT)
PREFILLED_SYRINGE | INTRAVENOUS | Status: DC | PRN
  Administered 2021-12-28 (×2): 80 ug via INTRAVENOUS

## 2021-12-28 SURGICAL SUPPLY — 35 items
BAG COUNTER SPONGE SURGICOUNT (BAG) ×1 IMPLANT
BIT DRILL 110X2.5XQCK CNCT (BIT) IMPLANT
BIT DRILL 2.5 (BIT) ×1
BIT DRL 110X2.5XQCK CNCT (BIT) ×1
BNDG COHESIVE 4X5 TAN STRL LF (GAUZE/BANDAGES/DRESSINGS) IMPLANT
BNDG COHESIVE 6X5 TAN NS LF (GAUZE/BANDAGES/DRESSINGS) IMPLANT
BNDG GAUZE DERMACEA FLUFF 4 (GAUZE/BANDAGES/DRESSINGS) IMPLANT
COVER SURGICAL LIGHT HANDLE (MISCELLANEOUS) ×1 IMPLANT
DRAPE OEC MINIVIEW 54X84 (DRAPES) IMPLANT
DRAPE U-SHAPE 47X51 STRL (DRAPES) ×1 IMPLANT
DRSG ADAPTIC 3X8 NADH LF (GAUZE/BANDAGES/DRESSINGS) IMPLANT
DURAPREP 26ML APPLICATOR (WOUND CARE) ×1 IMPLANT
ELECT REM PT RETURN 9FT ADLT (ELECTROSURGICAL) ×1
ELECTRODE REM PT RTRN 9FT ADLT (ELECTROSURGICAL) ×1 IMPLANT
GAUZE SPONGE 4X4 12PLY STRL (GAUZE/BANDAGES/DRESSINGS) ×1 IMPLANT
GLOVE BIOGEL PI IND STRL 9 (GLOVE) ×1 IMPLANT
GLOVE SURG ORTHO 9.0 STRL STRW (GLOVE) ×1 IMPLANT
GOWN STRL REUS W/ TWL XL LVL3 (GOWN DISPOSABLE) ×3 IMPLANT
GOWN STRL REUS W/TWL XL LVL3 (GOWN DISPOSABLE) ×3
KIT BASIN OR (CUSTOM PROCEDURE TRAY) ×1 IMPLANT
KIT TURNOVER KIT B (KITS) ×1 IMPLANT
NS IRRIG 1000ML POUR BTL (IV SOLUTION) ×1 IMPLANT
PACK ORTHO EXTREMITY (CUSTOM PROCEDURE TRAY) ×1 IMPLANT
PAD ABD 8X10 STRL (GAUZE/BANDAGES/DRESSINGS) IMPLANT
PAD ARMBOARD 7.5X6 YLW CONV (MISCELLANEOUS) ×2 IMPLANT
PLATE 7HOLE 1/3 TUBULAR (Plate) IMPLANT
SCREW CORT 2.5X20X3.5XST SM (Screw) IMPLANT
SCREW CORTICAL 3.5 16MM (Screw) IMPLANT
SCREW CORTICAL 3.5X12 (Screw) IMPLANT
SCREW CORTICAL 3.5X20 (Screw) ×1 IMPLANT
SUCTION FRAZIER HANDLE 10FR (MISCELLANEOUS) ×1
SUCTION TUBE FRAZIER 10FR DISP (MISCELLANEOUS) ×1 IMPLANT
SUT ETHILON 2 0 PSLX (SUTURE) IMPLANT
TOWEL GREEN STERILE (TOWEL DISPOSABLE) ×1 IMPLANT
TOWEL GREEN STERILE FF (TOWEL DISPOSABLE) ×1 IMPLANT

## 2021-12-28 NOTE — H&P (Signed)
Colisha Heavenly Christine is an 51 y.o. female.   Chief Complaint: Right ankle pain HPI: Patient is a 51 year old woman who sustained a Weber B right fibular fracture closed when she slipped in a storage unit on 12/20/2021. Patient went to med center at [facility] radiographs were obtained she is placed in a fracture boot and is seen today for initial evaluation.   Past Medical History:  Diagnosis Date   Asthma    Headache    Hypertension     Past Surgical History:  Procedure Laterality Date   COLONOSCOPY     MOUTH SURGERY      No family history on file. Social History:  reports that she has quit smoking. Her smoking use included cigarettes. She does not have any smokeless tobacco history on file. She reports that she does not drink alcohol and does not use drugs.  Allergies: No Known Allergies  No medications prior to admission.    No results found for this or any previous visit (from the past 48 hour(s)). No results found.  Review of Systems  All other systems reviewed and are negative.   There were no vitals taken for this visit. Physical Exam  Patient is alert, oriented, no adenopathy, well-dressed, normal affect, normal respiratory effort. Examination patient has a palpable dorsalis pedis pulse.  The deltoid ligament is nontender to palpation the syndesmosis is not tender to palpation.  The proximal tibia and fibular are not tender to palpation.  Patient is tender to palpation laterally over the ankle.  Radiograph shows a displaced Weber B fibular fracture closed. Assessment/Plan 1. Pain in right ankle and joints of right foot   2. Closed fracture of right ankle, initial encounter       Plan: With the displaced ankle fracture have recommended proceeding with open reduction internal fixation.  Risk and benefits were discussed including infection neurovascular injury persistent pain DVT arthritis.  Patient states she understands wished to proceed at this time plan for  outpatient surgery on Friday.  [name], MD 12/28/2021, 7:04 AM

## 2021-12-28 NOTE — Interval H&P Note (Signed)
History and Physical Interval Note:  12/28/2021 9:32 AM  Elizabeth Perry  has presented today for surgery, with the diagnosis of Right Ankle Fracture.  The various methods of treatment have been discussed with the patient and family. After consideration of risks, benefits and other options for treatment, the patient has consented to  Procedure(s): OPEN REDUCTION INTERNAL FIXATION (ORIF) RIGHT ANKLE FRACTURE (Right) as a surgical intervention.  The patient's history has been reviewed, patient examined, no change in status, stable for surgery.  I have reviewed the patient's chart and labs.  Questions were answered to the patient's satisfaction.     [name]

## 2021-12-28 NOTE — Transfer of Care (Signed)
Immediate Anesthesia Transfer of Care Note  Patient: Elizabeth Perry  Procedure(s) Performed: OPEN REDUCTION INTERNAL FIXATION (ORIF) RIGHT ANKLE FRACTURE (Right: Ankle)  Patient Location: PACU  Anesthesia Type:General and Regional  Level of Consciousness: drowsy  Airway & Oxygen Therapy: Patient Spontanous Breathing  Post-op Assessment: Report given to RN and Post -op Vital signs reviewed and stable  Post vital signs: Reviewed and stable  Last Vitals:  Vitals Value Taken Time  BP 128/78 12/28/21 1405  Temp    Pulse 66 12/28/21 1408  Resp 12 12/28/21 1408  SpO2 100 % 12/28/21 1408  Vitals shown include unvalidated device data.  Last Pain:  Vitals:   12/28/21 1102  TempSrc:   PainSc: 0-No pain      Patients Stated Pain Goal: 0 (12/28/21 1102)  Complications: No notable events documented.

## 2021-12-28 NOTE — Anesthesia Procedure Notes (Signed)
Anesthesia Regional Block: Adductor canal block   Pre-Anesthetic Checklist: , timeout performed,  Correct Patient, Correct Site, Correct Laterality,  Correct Procedure, Correct Position, site marked,  Risks and benefits discussed,  Pre-op evaluation,  At surgeon's request and post-op pain management  Laterality: Right  Prep: Maximum Sterile Barrier Precautions used, chloraprep       Needles:  Injection technique: Single-shot  Needle Type: Echogenic Stimulator Needle     Needle Length: 9cm  Needle Gauge: 21     Additional Needles:   Procedures:,,,, ultrasound used (permanent image in chart),,    Narrative:  Start time: 12/28/2021 11:42 AM End time: 12/28/2021 11:45 AM Injection made incrementally with aspirations every 5 mL. Anesthesiologist: Elmer Picker, MD

## 2021-12-28 NOTE — Anesthesia Postprocedure Evaluation (Signed)
Anesthesia Post Note  Patient: Aahna Rossa Mullen  Procedure(s) Performed: OPEN REDUCTION INTERNAL FIXATION (ORIF) RIGHT ANKLE FRACTURE (Right: Ankle)     Patient location during evaluation: PACU Anesthesia Type: Regional and General Level of consciousness: awake and alert Pain management: pain level controlled Vital Signs Assessment: post-procedure vital signs reviewed and stable Respiratory status: spontaneous breathing, nonlabored ventilation, respiratory function stable and patient connected to nasal cannula oxygen Cardiovascular status: blood pressure returned to baseline and stable Postop Assessment: no apparent nausea or vomiting Anesthetic complications: no  No notable events documented.  Last Vitals:  Vitals:   12/28/21 1415 12/28/21 1430  BP: 129/84 128/80  Pulse: 80 60  Resp: 12 11  Temp:  36.5 C  SpO2: 97% 100%    Last Pain:  Vitals:   12/28/21 1430  TempSrc:   PainSc: 0-No pain                 Zayd Bonet L Breanna Mcdaniel

## 2021-12-28 NOTE — Anesthesia Procedure Notes (Signed)
Anesthesia Regional Block: Popliteal block   Pre-Anesthetic Checklist: , timeout performed,  Correct Patient, Correct Site, Correct Laterality,  Correct Procedure, Correct Position, site marked,  Risks and benefits discussed,  Pre-op evaluation,  At surgeon's request and post-op pain management  Laterality: Right  Prep: Maximum Sterile Barrier Precautions used, chloraprep       Needles:  Injection technique: Single-shot  Needle Type: Echogenic Stimulator Needle     Needle Length: 9cm  Needle Gauge: 21     Additional Needles:   Procedures:,,,, ultrasound used (permanent image in chart),,    Narrative:  Start time: 12/28/2021 11:40 AM End time: 12/28/2021 11:42 AM Injection made incrementally with aspirations every 5 mL. Anesthesiologist: Elmer Picker, MD

## 2021-12-28 NOTE — Anesthesia Procedure Notes (Signed)
Procedure Name: LMA Insertion Date/Time: 12/28/2021 1:25 PM  Performed by: Maxine Glenn, CRNAPre-anesthesia Checklist: Patient identified, Emergency Drugs available, Suction available and Patient being monitored Patient Re-evaluated:Patient Re-evaluated prior to induction Oxygen Delivery Method: Circle System Utilized Preoxygenation: Pre-oxygenation with 100% oxygen Induction Type: IV induction Ventilation: Mask ventilation without difficulty LMA: LMA inserted LMA Size: 3.0 Number of attempts: 1 Airway Equipment and Method: Bite block Placement Confirmation: positive ETCO2 Tube secured with: Tape Dental Injury: Teeth and Oropharynx as per pre-operative assessment  Comments: Attempted to place LMA 4, but unable to fit past the teeth. LMA 3 placed instead

## 2021-12-28 NOTE — Anesthesia Preprocedure Evaluation (Addendum)
Anesthesia Evaluation  Patient identified by MRN, date of birth, ID band Patient awake    Reviewed: Allergy & Precautions, NPO status , Patient's Chart, lab work & pertinent test results, reviewed documented beta blocker date and time   Airway Mallampati: I  TM Distance: >3 FB Neck ROM: Full    Dental no notable dental hx. (+) Teeth Intact, Dental Advisory Given   Pulmonary asthma , former smoker   Pulmonary exam normal breath sounds clear to auscultation       Cardiovascular hypertension, Pt. on medications and Pt. on home beta blockers Normal cardiovascular exam Rhythm:Regular Rate:Normal     Neuro/Psych  Headaches  negative psych ROS   GI/Hepatic negative GI ROS, Neg liver ROS,,,  Endo/Other  negative endocrine ROS    Renal/GU negative Renal ROS  negative genitourinary   Musculoskeletal negative musculoskeletal ROS (+)    Abdominal   Peds  Hematology negative hematology ROS (+)   Anesthesia Other Findings   Reproductive/Obstetrics                             Anesthesia Physical Anesthesia Plan  ASA: 2  Anesthesia Plan: General and Regional   Post-op Pain Management: Regional block* and Tylenol PO (pre-op)*   Induction: Intravenous  PONV Risk Score and Plan: 3 and Ondansetron, Dexamethasone and Midazolam  Airway Management Planned: LMA  Additional Equipment:   Intra-op Plan:   Post-operative Plan: Extubation in OR  Informed Consent: I have reviewed the patients History and Physical, chart, labs and discussed the procedure including the risks, benefits and alternatives for the proposed anesthesia with the patient or authorized representative who has indicated his/her understanding and acceptance.     Dental advisory given  Plan Discussed with: CRNA  Anesthesia Plan Comments:        Anesthesia Quick Evaluation

## 2021-12-28 NOTE — Op Note (Signed)
12/28/2021  2:14 PM  PATIENT:  Elizabeth Perry    PRE-OPERATIVE DIAGNOSIS:  Right Ankle Fracture  POST-OPERATIVE DIAGNOSIS:  Same  PROCEDURE:  OPEN REDUCTION INTERNAL FIXATION (ORIF) RIGHT ANKLE FRACTURE Seen on fluoroscopy to verify reduction.  SURGEON:  [name], MD  PHYSICIAN ASSISTANT:None ANESTHESIA:   General  PREOPERATIVE INDICATIONS:  [name] is a  51 y.o. female with a diagnosis of Right Ankle Fracture who failed conservative measures and elected for surgical management.    The risks benefits and alternatives were discussed with the patient preoperatively including but not limited to the risks of infection, bleeding, nerve injury, cardiopulmonary complications, the need for revision surgery, among others, and the patient was willing to proceed.  OPERATIVE IMPLANTS: 7 hole one third tubular plate  @ENCIMAGES @  OPERATIVE FINDINGS: C arm fluoroscopy verified reduction of the mortise and fibula out to length.  OPERATIVE PROCEDURE: Patient was brought the operating room and underwent a general anesthetic.  After adequate levels anesthesia were obtained patient's right lower extremity was prepped using DuraPrep draped into a sterile field a timeout was called.  A lateral incision was made over the fibula this was carried down to bone and subperiosteal dissection was used to debride the Weber of the fibular fracture.  This was debrided of fragments within the fracture this was irrigated the fracture was reduced with the fibula out to length and stabilized with a lag screw 20 mm in length.  A neutralization plate was then placed laterally curved distally with 2 compression screws proximally and 1 compression screw distally.  C-arm fluoroscopy verified congruent mortise with the fibula out to length.  The wound was irrigated with normal saline the incision closed using 2-0 nylon a sterile dressing was applied patient was extubated taken the PACU in stable  condition.   DISCHARGE PLANNING:  Antibiotic duration: Preoperative antibiotics  Weightbearing: Touchdown weightbearing on the right  Pain medication: Prescription for Percocet  Dressing care/ Wound VAC: Dry dressing  Ambulatory devices: Crutches and fracture boot  Discharge to: Home.  Follow-up: In the office 1 week post operative.

## 2021-12-31 ENCOUNTER — Ambulatory Visit (INDEPENDENT_AMBULATORY_CARE_PROVIDER_SITE_OTHER): Payer: Managed Care, Other (non HMO) | Admitting: Orthopedic Surgery

## 2021-12-31 ENCOUNTER — Telehealth: Payer: Self-pay | Admitting: Radiology

## 2021-12-31 ENCOUNTER — Encounter: Payer: Self-pay | Admitting: Orthopedic Surgery

## 2021-12-31 DIAGNOSIS — S82891A Other fracture of right lower leg, initial encounter for closed fracture: Secondary | ICD-10-CM

## 2021-12-31 MED ORDER — OXYCODONE-ACETAMINOPHEN 5-325 MG PO TABS: 1.0000 | ORAL_TABLET | Freq: Four times a day (QID) | ORAL | 0 refills | Status: DC | PRN

## 2021-12-31 MED ORDER — IBUPROFEN 800 MG PO TABS: 800.0000 mg | ORAL_TABLET | Freq: Three times a day (TID) | ORAL | 1 refills | Status: DC

## 2021-12-31 NOTE — Telephone Encounter (Signed)
Called pt and will come in today at 10:45 for eval

## 2021-12-31 NOTE — Progress Notes (Signed)
Office Visit Note   Patient: Elizabeth Perry           Date of Birth: 22-Nov-1970           MRN: [id number] Visit Date: 12/31/2021              Requested by: Daisy Floro, MD [address] [place],  [place] [address] PCP: Daisy Floro, MD  Chief Complaint  Patient presents with   Right Ankle - Routine Post Op    12/28/2021 right ankle ORIF       HPI: Patient is a 51 year old woman who is seen status post open reduction internal fixation right Weber B fibular fracture.  Patient complains of increased swelling and pain.  Assessment & Plan: Visit Diagnoses:  1. Closed fracture of right ankle, initial encounter     Plan: Recommended ice and elevation a prescription was written for her to use ibuprofen 800 mg 3 times a day with meals and oxycodone in between.  Recommended range of motion of her toes and ankle and to work on dorsiflexion of the ankle as well.  Three-view radiographs of the right ankle at follow-up.  Follow-Up Instructions: Return in about 1 week (around 01/07/2022).   Ortho Exam  Patient is alert, oriented, no adenopathy, well-dressed, normal affect, normal respiratory effort. Examination patient has increased swelling of the right lower extremity there is no cellulitis no signs of infection.  Imaging: No results found. No images are attached to the encounter.  Labs: No results found for: "HGBA1C", "ESRSEDRATE", "CRP", "LABURIC", "REPTSTATUS", "GRAMSTAIN", "CULT", "LABORGA"   Lab Results  Component Value Date   ALBUMIN 3.9 11/28/2011    No results found for: "MG" No results found for: "VD25OH"  No results found for: "PREALBUMIN"    Latest Ref Rng & Units 12/28/2021    9:38 AM 11/28/2011   12:52 PM  CBC EXTENDED  WBC 4.0 - 10.5 K/uL 6.3  4.0   RBC 3.87 - 5.11 MIL/uL 4.64  4.63   Hemoglobin 12.0 - 15.0 g/dL 64.3  32.9   HCT 51.8 - 46.0 % 40.8  39.8   Platelets 150 - 400 K/uL 250  226   NEUT# 1.7 - 7.7 K/uL  1.4   Lymph# 0.7  - 4.0 K/uL  2.1      There is no height or weight on file to calculate BMI.  Orders:  No orders of the defined types were placed in this encounter.  Meds ordered this encounter  Medications   ibuprofen (ADVIL) 800 MG tablet    Sig: Take 1 tablet (800 mg total) by mouth 3 (three) times daily.    Dispense:  60 tablet    Refill:  1   oxyCODONE-acetaminophen (PERCOCET/ROXICET) 5-325 MG tablet    Sig: Take 1 tablet by mouth every 6 (six) hours as needed for severe pain.    Dispense:  60 tablet    Refill:  0     Procedures: No procedures performed  Clinical Data: No additional findings.  ROS:  All other systems negative, except as noted in the HPI. Review of Systems  Objective: Vital Signs: There were no vitals taken for this visit.  Specialty Comments:  No specialty comments available.  PMFS History: Patient Active Problem List   Diagnosis Date Noted   Closed fracture of right ankle 12/28/2021   Past Medical History:  Diagnosis Date   Asthma    Headache    Hypertension     Family History  Problem Relation Age of Onset   Hypertension Mother    Diabetes Mother    Diabetes Father    Hypertension Father    Hypertension Sister    Hypertension Brother     Past Surgical History:  Procedure Laterality Date   COLONOSCOPY     MOUTH SURGERY     Social History   Occupational History   Not on file  Tobacco Use   Smoking status: Former    Types: Cigarettes   Smokeless tobacco: Not on file  Vaping Use   Vaping Use: Never used  Substance and Sexual Activity   Alcohol use: No   Drug use: No   Sexual activity: Not on file

## 2022-01-01 ENCOUNTER — Encounter (HOSPITAL_COMMUNITY): Payer: Self-pay | Admitting: Orthopedic Surgery

## 2022-01-10 ENCOUNTER — Encounter: Payer: Self-pay | Admitting: Orthopedic Surgery

## 2022-01-10 ENCOUNTER — Ambulatory Visit: Payer: Managed Care, Other (non HMO) | Admitting: Orthopedic Surgery

## 2022-01-10 ENCOUNTER — Ambulatory Visit (INDEPENDENT_AMBULATORY_CARE_PROVIDER_SITE_OTHER): Payer: Managed Care, Other (non HMO)

## 2022-01-10 DIAGNOSIS — Z9889 Other specified postprocedural states: Secondary | ICD-10-CM

## 2022-01-10 DIAGNOSIS — Z8781 Personal history of (healed) traumatic fracture: Secondary | ICD-10-CM

## 2022-01-10 NOTE — Progress Notes (Signed)
Office Visit Note   Patient: Elizabeth Perry           Date of Birth: 11/24/1970           MRN: [id number] Visit Date: 01/10/2022              Requested by: Daisy Floro, MD [address] [place],  [place] [address] PCP: Daisy Floro, MD  Chief Complaint  Patient presents with   Right Ankle - Routine Post Op    12/28/2021 right ankle ORIF      HPI: Patient is a 51 year old woman who is 2 weeks status post open reduction internal fixation Weber B right fibular fracture.  Assessment & Plan: Visit Diagnoses:  1. S/P ORIF (open reduction internal fixation) fracture     Plan: Patient will start range of motion of her toes and ankle and a prescription was written for physical therapy upstairs.  Patient will advance to weightbearing as tolerated in the fracture boot.  Follow-Up Instructions: Return in about 4 weeks (around 02/07/2022).   Ortho Exam  Patient is alert, oriented, no adenopathy, well-dressed, normal affect, normal respiratory effort. Examination there is swelling in the right lower extremity the incision is well-approximated we will harvest the sutures today.  Patient was demonstrated range of motion exercises for the foot and ankle.  There is no redness or cellulitis no signs of infection.  Imaging: XR Ankle Complete Right  Result Date: 01/10/2022 Three-view radiographs of the right ankle shows stable internal fixation of the Weber B fibular fracture.  The mortise is congruent.  No images are attached to the encounter.  Labs: No results found for: "HGBA1C", "ESRSEDRATE", "CRP", "LABURIC", "REPTSTATUS", "GRAMSTAIN", "CULT", "LABORGA"   Lab Results  Component Value Date   ALBUMIN 3.9 11/28/2011    No results found for: "MG" No results found for: "VD25OH"  No results found for: "PREALBUMIN"    Latest Ref Rng & Units 12/28/2021    9:38 AM 11/28/2011   12:52 PM  CBC EXTENDED  WBC 4.0 - 10.5 K/uL 6.3  4.0   RBC 3.87 - 5.11 MIL/uL 4.64   4.63   Hemoglobin 12.0 - 15.0 g/dL 98.3  38.2   HCT 50.5 - 46.0 % 40.8  39.8   Platelets 150 - 400 K/uL 250  226   NEUT# 1.7 - 7.7 K/uL  1.4   Lymph# 0.7 - 4.0 K/uL  2.1      There is no height or weight on file to calculate BMI.  Orders:  Orders Placed This Encounter  Procedures   XR Ankle Complete Right   Ambulatory referral to Physical Therapy   No orders of the defined types were placed in this encounter.    Procedures: No procedures performed  Clinical Data: No additional findings.  ROS:  All other systems negative, except as noted in the HPI. Review of Systems  Objective: Vital Signs: There were no vitals taken for this visit.  Specialty Comments:  No specialty comments available.  PMFS History: Patient Active Problem List   Diagnosis Date Noted   Closed fracture of right ankle 12/28/2021   Past Medical History:  Diagnosis Date   Asthma    Headache    Hypertension     Family History  Problem Relation Age of Onset   Hypertension Mother    Diabetes Mother    Diabetes Father    Hypertension Father    Hypertension Sister    Hypertension Brother  Past Surgical History:  Procedure Laterality Date   COLONOSCOPY     MOUTH SURGERY     ORIF ANKLE FRACTURE Right 12/28/2021   Procedure: OPEN REDUCTION INTERNAL FIXATION (ORIF) RIGHT ANKLE FRACTURE;  Surgeon: [name], MD;  Location: [facility] OR;  Service: Orthopedics;  Laterality: Right;   Social History   Occupational History   Not on file  Tobacco Use   Smoking status: Former    Types: Cigarettes   Smokeless tobacco: Not on file  Vaping Use   Vaping Use: Never used  Substance and Sexual Activity   Alcohol use: No   Drug use: No   Sexual activity: Not on file

## 2022-01-14 ENCOUNTER — Ambulatory Visit (INDEPENDENT_AMBULATORY_CARE_PROVIDER_SITE_OTHER): Payer: Managed Care, Other (non HMO) | Admitting: Physical Therapy

## 2022-01-14 ENCOUNTER — Encounter: Payer: Self-pay | Admitting: Physical Therapy

## 2022-01-14 DIAGNOSIS — R600 Localized edema: Secondary | ICD-10-CM | POA: Diagnosis not present

## 2022-01-14 DIAGNOSIS — R262 Difficulty in walking, not elsewhere classified: Secondary | ICD-10-CM | POA: Diagnosis not present

## 2022-01-14 DIAGNOSIS — M25571 Pain in right ankle and joints of right foot: Secondary | ICD-10-CM

## 2022-01-14 DIAGNOSIS — M25671 Stiffness of right ankle, not elsewhere classified: Secondary | ICD-10-CM

## 2022-01-14 NOTE — Therapy (Signed)
OUTPATIENT PHYSICAL THERAPY LOWER EXTREMITY EVALUATION   Patient Name: Elizabeth Perry MRN: [id number] DOB:[birth year], 51 y.o., female Today's Date: 01/14/2022  END OF SESSION:  PT End of Session - 01/14/22 1201     Visit Number 1    Number of Visits 20    Date for PT Re-Evaluation 03/29/22    PT Start Time 0845    PT Stop Time 0930    PT Time Calculation (min) 45 min    Activity Tolerance Patient tolerated treatment well;Patient limited by pain    Behavior During Therapy [facility] for tasks assessed/performed             Past Medical History:  Diagnosis Date   Asthma    Headache    Hypertension    Past Surgical History:  Procedure Laterality Date   COLONOSCOPY     MOUTH SURGERY     ORIF ANKLE FRACTURE Right 12/28/2021   Procedure: OPEN REDUCTION INTERNAL FIXATION (ORIF) RIGHT ANKLE FRACTURE;  Surgeon: Newt Minion, MD;  Location: [place];  Service: Orthopedics;  Laterality: Right;   Patient Active Problem List   Diagnosis Date Noted   Closed fracture of right ankle 12/28/2021    PCP:  Lawerance Cruel, MD   REFERRING PROVIDER:  Newt Minion, MD   REFERRING DIAG: [phone] (ICD-10-CM) - S/P ORIF (open reduction internal fixation) fracture  THERAPY DIAG:  Acute right ankle pain  Difficulty in walking, not elsewhere classified  Stiffness of right ankle, not elsewhere classified  Localized edema  Rationale for Evaluation and Treatment: Rehabilitation  ONSET DATE: 12/20/21  SUBJECTIVE:    SUBJECTIVE STATEMENT: Pt s/p Rt ORIF due to fall on Thanksgiving day. Pt amb in CAM walker. Pt stating more tightness in Rt ankle.   PERTINENT HISTORY: Asthma, HA, HTN PAIN:  NPRS scale: 4/10 in Rt foot/ankle, Pain location: Rt foot/ankle Pain description: tightness Aggravating factors: walking Relieving factors: ice, elevation, pain meds  PRECAUTIONS: None  WEIGHT BEARING RESTRICTIONS: no  FALLS:  Has patient fallen in last 6 months? Yes.  Number of falls 1  LIVING ENVIRONMENT: Lives with: lives with their family and lives alone Lives in: House/apartment Stairs:  Has following equipment at home: Crutches and CAM boot  OCCUPATION: works from home currently  PLOF: Independent  PATIENT GOALS: walk without device without pain     OBJECTIVE:   DIAGNOSTIC FINDINGS:  01/10/22 Narrative:  Three-view radiographs of the right ankle shows stable internal fixation of the Weber B fibular fracture.  The mortise is congruent.   PATIENT SURVEYS:  01/14/22: FOTO intake:   36%  COGNITION: Overall cognitive status: WFL    SENSATION: 01/14/22: WFL  EDEMA:  01/14/22:  Circumferential: Rt: 29 centimeters around malleoli Left: 23 centimeters     POSTURE: No Significant postural limitations  PALPATION: TTP; lateral ankle around lateral malleleus  LOWER EXTREMITY ROM:   ROM Right eval Left eval  Hip flexion    Hip extension    Hip abduction    Hip adduction    Hip internal rotation    Hip external rotation    Knee flexion    Knee extension    Ankle dorsiflexion A: 0  P: 4 A: 15 P: 20  Ankle plantarflexion A: 12 P: 15 A: 25  Ankle inversion A: 10 P: 12 A: 30  Ankle eversion A: 14 P: 18 A: 34   (Blank rows = not tested)  LOWER EXTREMITY MMT:  MMT Right eval Left eval  Hip flexion  5/5 5/5  Hip extension    Hip abduction 5/5 5/5  Hip adduction 5/5 5/5  Hip internal rotation    Hip external rotation    Knee flexion 4/5 5/5  Knee extension 4/5 5/5  Ankle dorsiflexion 2/5 5/5  Ankle plantarflexion 2/5 5/5  Ankle inversion 2/5 5/5  Ankle eversion 2/5 5/5   (Blank rows = not tested)   GAIT: Distance walked: 3 feet  Assistive device utilized: CAM boot on Rt Level of assistance: Modified independence Comments: antalgic gait pattern with UE support on mat table   TODAY'S TREATMENT                                                                          DATE: Therex:    HEP  instruction/performance c cues for techniques, handout provided.  Trial set performed of each for comprehension and symptom assessment.  See below for exercise list  PATIENT EDUCATION:  Education details: HEP, POC Person educated: Patient Education method: Explanation, Demonstration, Verbal cues, and Handouts Education comprehension: verbalized understanding, returned demonstration, and verbal cues required  HOME EXERCISE PROGRAM: Access Code: [email or web address] URL: [email or web address] Date: 01/14/2022 Prepared by: Kearney Hard  Exercises - Seated Ankle Alphabet  - 3 x daily - 7 x weekly - Towel Scrunches  - 2 x daily - 7 x weekly - 5 reps - Ankle Inversion Eversion Towel Slide  - 3 x daily - 7 x weekly - 10 reps - 2 seconds hold - Long Sitting Calf Stretch with Strap  - 3 x daily - 7 x weekly - 3 reps - 30 seconds hold  ASSESSMENT:  CLINICAL IMPRESSION: Patient is a 51 year old woman who is seen status post open reduction internal fixation right Weber B fibular fracture. Pt with complaints of Rt ankle pain with mobility, strength and movement coordination deficits that impair their ability to perform usual daily and recreational functional activities without increase difficulty/symptoms at this time.  Patient to benefit from skilled PT services to address impairments and limitations to improve to previous level of function without restriction secondary to condition.   OBJECTIVE IMPAIRMENTS: Abnormal gait, decreased activity tolerance, decreased balance, decreased mobility, difficulty walking, decreased ROM, decreased strength, increased edema, and pain.   ACTIVITY LIMITATIONS: bending, sitting, standing, squatting, sleeping, transfers, and dressing  PARTICIPATION LIMITATIONS: cleaning, driving, shopping, community activity, and occupation  PERSONAL FACTORS: 1-2 comorbidities: see pertinent history above  are also affecting patient's functional outcome.   REHAB POTENTIAL:  Excellent  CLINICAL DECISION MAKING: Stable/uncomplicated  EVALUATION COMPLEXITY: Low   GOALS: Goals reviewed with patient? Yes  SHORT TERM GOALS: (target date for Short term goals are 3 weeks 02/08/22)   1.  Patient will demonstrate independent use of home exercise program to maintain progress from in clinic treatments.  Goal status: New  LONG TERM GOALS: (target dates for all long term goals are 10 weeks  03/29/22 )   1. Patient will demonstrate/report pain at worst less than or equal to 2/10 to facilitate minimal limitation in daily activity secondary to pain symptoms.  Goal status: New   2. Patient will demonstrate independent use of home exercise program to facilitate ability to maintain/progress functional gains from skilled physical therapy services.  Goal status:  New   3. Patient will demonstrate FOTO outcome > or = 66 % to indicate reduced disability due to condition.  Goal status: New   4.  Patient will demonstrate >/= 4 LE MMT throughout to faciltiate usual transfers, stairs, squatting at [facility] for daily life.   Goal status: New   5.  Patient will be able to amb community distance with no device with step through gait pattern without antalgic gait pattern.  Goal status: New   6.  Pt will be able to navigate up and down 1 flight of stairs with single hand rail with step over step pattern.  Goal status: New   7.  Pt will improve Rt ankle DF to >/= 15 degrees actively for improved gait and functional mobility.  Goal Status: New   PLAN:  PT FREQUENCY: 1-2x/week  PT DURATION: 10 weeks  PLANNED INTERVENTIONS: Therapeutic exercises, Therapeutic activity, Neuro Muscular re-education, Balance training, Gait training, Patient/Family education, Joint mobilization, Stair training, DME instructions, Dry Needling, Electrical stimulation, Traction, Cryotherapy, vasopneumatic deviceMoist heat, Taping, Ultrasound, Ionotophoresis 4mg /ml Dexamethasone, and Manual therapy.  All  included unless contraindicated  PLAN FOR NEXT SESSION: Review HEP knowledge/results.          , PT, MPT 01/14/2022, 12:06 PM

## 2022-01-16 ENCOUNTER — Ambulatory Visit (INDEPENDENT_AMBULATORY_CARE_PROVIDER_SITE_OTHER): Payer: Managed Care, Other (non HMO) | Admitting: Physical Therapy

## 2022-01-16 ENCOUNTER — Encounter: Payer: Self-pay | Admitting: Physical Therapy

## 2022-01-16 DIAGNOSIS — M25571 Pain in right ankle and joints of right foot: Secondary | ICD-10-CM

## 2022-01-16 DIAGNOSIS — R600 Localized edema: Secondary | ICD-10-CM | POA: Diagnosis not present

## 2022-01-16 DIAGNOSIS — M25671 Stiffness of right ankle, not elsewhere classified: Secondary | ICD-10-CM

## 2022-01-16 DIAGNOSIS — R262 Difficulty in walking, not elsewhere classified: Secondary | ICD-10-CM | POA: Diagnosis not present

## 2022-01-16 NOTE — Therapy (Signed)
OUTPATIENT PHYSICAL THERAPY TREATMENT NOTE   Patient Name: Elizabeth Perry MRN: [id number] DOB:04/22/1970, 51 y.o., female Today's Date: 01/16/2022  PCP: Daisy Floro, MD  REFERRING PROVIDER: [name], MD    END OF SESSION:   PT End of Session - 01/16/22 0904     Visit Number 2    Number of Visits 20    Date for PT Re-Evaluation 03/29/22    PT Start Time 0845    PT Stop Time 0930    PT Time Calculation (min) 45 min    Activity Tolerance Patient tolerated treatment well;Patient limited by pain    Behavior During Therapy [facility] for tasks assessed/performed             Past Medical History:  Diagnosis Date   Asthma    Headache    Hypertension    Past Surgical History:  Procedure Laterality Date   COLONOSCOPY     MOUTH SURGERY     ORIF ANKLE FRACTURE Right 12/28/2021   Procedure: OPEN REDUCTION INTERNAL FIXATION (ORIF) RIGHT ANKLE FRACTURE;  Surgeon: [name], MD;  Location: [facility];  Service: Orthopedics;  Laterality: Right;   Patient Active Problem List   Diagnosis Date Noted   Closed fracture of right ankle 12/28/2021    REFERRING DIAG:  Z98.890,Z87.81 (ICD-10-CM) - S/P ORIF (open reduction internal fixation) fracture   THERAPY DIAG:  Acute right ankle pain  Stiffness of right ankle, not elsewhere classified  Difficulty in walking, not elsewhere classified  Localized edema  Rationale for Evaluation and Treatment Rehabilitation  PERTINENT HISTORY: Asthma, HA, HTN   PRECAUTIONS: none  SUBJECTIVE:                                                                                                                                                                                      SUBJECTIVE STATEMENT:   Pt arriving today reporting 3-4/10 pain in her Rt anke/foot. Pt amb today with    PAIN:  Are you having pain? Yes, 3-/410 in Rt ankle   OBJECTIVE: (objective measures completed at initial evaluation unless otherwise  dated)  DIAGNOSTIC FINDINGS:  01/10/22 Narrative:  Three-view radiographs of the right ankle shows stable internal fixation of the Weber B fibular fracture.  The mortise is congruent.    PATIENT SURVEYS:  01/14/22: FOTO intake:   36%   COGNITION: Overall cognitive status: WFL                  SENSATION: 01/14/22: WFL   EDEMA:  01/14/22:  Circumferential: Rt: 29 centimeters around malleoli Left: 23 centimeters        POSTURE: No Significant postural  limitations   PALPATION: TTP; lateral ankle around lateral malleleus   LOWER EXTREMITY ROM:    ROM Right eval Left eval  Hip flexion      Hip extension      Hip abduction      Hip adduction      Hip internal rotation      Hip external rotation      Knee flexion      Knee extension      Ankle dorsiflexion A: 0  P: 4 A: 15 P: 20  Ankle plantarflexion A: 12 P: 15 A: 25  Ankle inversion A: 10 P: 12 A: 30  Ankle eversion A: 14 P: 18 A: 34   (Blank rows = not tested)   LOWER EXTREMITY MMT:   MMT Right eval Left eval  Hip flexion 5/5 5/5  Hip extension      Hip abduction 5/5 5/5  Hip adduction 5/5 5/5  Hip internal rotation      Hip external rotation      Knee flexion 4/5 5/5  Knee extension 4/5 5/5  Ankle dorsiflexion 2/5 5/5  Ankle plantarflexion 2/5 5/5  Ankle inversion 2/5 5/5  Ankle eversion 2/5 5/5   (Blank rows = not tested)     GAIT: Distance walked: 3 feet  Assistive device utilized: CAM boot on Rt Level of assistance: Modified independence Comments: antalgic gait pattern with UE support on mat table     TODAY'S TREATMENT                                                                          DATE: 01/16/22: TherEx:  rocker board df/pf x 2 minutes and inv/eversion x 2 minutes marble pick up x 4 minutes DF stretch x 3 holding 30 seconds 4 way ankle exercises with Level 2 band x 15 each Manual: PROM Rt ankle Modalities:  Vasopneumatic: 34 deg, medium compression, x 15 minutes      01/14/22:  Therex:    HEP instruction/performance c cues for techniques, handout provided.  Trial set performed of each for comprehension and symptom assessment.  See below for exercise list   PATIENT EDUCATION:  Education details: HEP, POC Person educated: Patient Education method: Explanation, Demonstration, Verbal cues, and Handouts Education comprehension: verbalized understanding, returned demonstration, and verbal cues required   HOME EXERCISE PROGRAM: Access Code: [email or web address] URL: [email or web address] Date: 01/16/2022 Prepared by: Narda Amber  Exercises - Seated Ankle Alphabet  - 3 x daily - 7 x weekly - Towel Scrunches  - 2 x daily - 7 x weekly - 5 reps - Ankle Inversion Eversion Towel Slide  - 3 x daily - 7 x weekly - 10 reps - 2 seconds hold - Long Sitting Calf Stretch with Strap  - 3 x daily - 7 x weekly - 3 reps - 30 seconds hold - Ankle Dorsiflexion with Resistance  - 2 x daily - 7 x weekly - 2 sets - 10 reps - Ankle Eversion with Resistance  - 2 x daily - 7 x weekly - 2 sets - 10 reps - Ankle Inversion with Resistance  - 2 x daily - 7 x weekly - 2 sets - 10 reps - Ankle and Toe Plantarflexion  with Resistance  - 2 x daily - 7 x weekly - 2 sets - 10 reps   ASSESSMENT:   CLINICAL IMPRESSION: Pt stating 3-4/10 pain upon arrival to therapy. Pt stating pain gets worse throughout the day. Pt stating her ankle feels more stiffness/tightness. Pt tolerating exercises well. Continue skilled PT and progress to more weight bearing activities as tolerated.    OBJECTIVE IMPAIRMENTS: Abnormal gait, decreased activity tolerance, decreased balance, decreased mobility, difficulty walking, decreased ROM, decreased strength, increased edema, and pain.    ACTIVITY LIMITATIONS: bending, sitting, standing, squatting, sleeping, transfers, and dressing   PARTICIPATION LIMITATIONS: cleaning, driving, shopping, community activity, and occupation   PERSONAL FACTORS: 1-2  comorbidities: see pertinent history above  are also affecting patient's functional outcome.    REHAB POTENTIAL: Excellent   CLINICAL DECISION MAKING: Stable/uncomplicated   EVALUATION COMPLEXITY: Low     GOALS: Goals reviewed with patient? Yes   SHORT TERM GOALS: (target date for Short term goals are 3 weeks 02/08/22)    1.  Patient will demonstrate independent use of home exercise program to maintain progress from in clinic treatments.   Goal status: New   LONG TERM GOALS: (target dates for all long term goals are 10 weeks  03/29/22 )   1. Patient will demonstrate/report pain at worst less than or equal to 2/10 to facilitate minimal limitation in daily activity secondary to pain symptoms.   Goal status: New   2. Patient will demonstrate independent use of home exercise program to facilitate ability to maintain/progress functional gains from skilled physical therapy services.   Goal status: New   3. Patient will demonstrate FOTO outcome > or = 66 % to indicate reduced disability due to condition.   Goal status: New   4.  Patient will demonstrate >/= 4 LE MMT throughout to faciltiate usual transfers, stairs, squatting at [facility] for daily life.    Goal status: New   5.  Patient will be able to amb community distance with no device with step through gait pattern without antalgic gait pattern.  Goal status: New   6.  Pt will be able to navigate up and down 1 flight of stairs with single hand rail with step over step pattern.  Goal status: New   7.  Pt will improve Rt ankle DF to >/= 15 degrees actively for improved gait and functional mobility.  Goal Status: New     PLAN:   PT FREQUENCY: 1-2x/week   PT DURATION: 10 weeks   PLANNED INTERVENTIONS: Therapeutic exercises, Therapeutic activity, Neuro Muscular re-education, Balance training, Gait training, Patient/Family education, Joint mobilization, Stair training, DME instructions, Dry Needling, Electrical stimulation,  Traction, Cryotherapy, vasopneumatic deviceMoist heat, Taping, Ultrasound, Ionotophoresis 4mg /ml Dexamethasone, and Manual therapy.  All included unless contraindicated   PLAN FOR NEXT SESSION: ankle ROM, strengthening, begin weight bearing as tolerated, Nustep        , PT, MPT 01/16/2022, 9:36 AM

## 2022-01-23 ENCOUNTER — Encounter: Payer: Managed Care, Other (non HMO) | Admitting: Physical Therapy

## 2022-01-23 NOTE — Therapy (Incomplete)
OUTPATIENT PHYSICAL THERAPY TREATMENT NOTE   Patient Name: Elizabeth Perry MRN: [id number] DOB:December 05, 1970, 51 y.o., female Today's Date: 01/23/2022  PCP: Lawerance Cruel, MD  REFERRING PROVIDER: Newt Minion, MD    END OF SESSION:     Past Medical History:  Diagnosis Date   Asthma    Headache    Hypertension    Past Surgical History:  Procedure Laterality Date   COLONOSCOPY     MOUTH SURGERY     ORIF ANKLE FRACTURE Right 12/28/2021   Procedure: OPEN REDUCTION INTERNAL FIXATION (ORIF) RIGHT ANKLE FRACTURE;  Surgeon: Newt Minion, MD;  Location: [place];  Service: Orthopedics;  Laterality: Right;   Patient Active Problem List   Diagnosis Date Noted   Closed fracture of right ankle 12/28/2021    REFERRING DIAG:  Z98.890,Z87.81 (ICD-10-CM) - S/P ORIF (open reduction internal fixation) fracture   THERAPY DIAG:  No diagnosis found.  Rationale for Evaluation and Treatment Rehabilitation  PERTINENT HISTORY: Asthma, HA, HTN   PRECAUTIONS: none  SUBJECTIVE:                                                                                                                                                                                      SUBJECTIVE STATEMENT:   *** Pt arriving today reporting 3-4/10 pain in her Rt anke/foot. Pt amb today with   PAIN:  Are you having pain? Yes, *** 3-/410 in Rt ankle   OBJECTIVE: (objective measures completed at initial evaluation unless otherwise dated)  DIAGNOSTIC FINDINGS:  01/10/22 Narrative:  Three-view radiographs of the right ankle shows stable internal fixation of the Weber B fibular fracture.  The mortise is congruent.    PATIENT SURVEYS:  01/14/22: FOTO intake:   36%   COGNITION: Overall cognitive status: WFL                  SENSATION: 01/14/22: WFL   EDEMA:  01/14/22:  Circumferential: Rt: 29 centimeters around malleoli Left: 23 centimeters        POSTURE: No Significant postural limitations    PALPATION: TTP; lateral ankle around lateral malleleus   LOWER EXTREMITY ROM:    ROM Right eval Left eval  Hip flexion      Hip extension      Hip abduction      Hip adduction      Hip internal rotation      Hip external rotation      Knee flexion      Knee extension      Ankle dorsiflexion A: 0  P: 4 A: 15 P: 20  Ankle plantarflexion A: 12 P: 15 A: 25  Ankle inversion A: 10 P: 12 A: 30  Ankle eversion A: 14 P: 18 A: 34   (Blank rows = not tested)   LOWER EXTREMITY MMT:   MMT Right eval Left eval  Hip flexion 5/5 5/5  Hip extension      Hip abduction 5/5 5/5  Hip adduction 5/5 5/5  Hip internal rotation      Hip external rotation      Knee flexion 4/5 5/5  Knee extension 4/5 5/5  Ankle dorsiflexion 2/5 5/5  Ankle plantarflexion 2/5 5/5  Ankle inversion 2/5 5/5  Ankle eversion 2/5 5/5   (Blank rows = not tested)     GAIT: Distance walked: 3 feet  Assistive device utilized: CAM boot on Rt Level of assistance: Modified independence Comments: antalgic gait pattern with UE support on mat table     TODAY'S TREATMENT                                                                          DATE: 01/23/2022: ***  01/16/22: TherEx:  rocker board df/pf x 2 minutes and inv/eversion x 2 minutes marble pick up x 4 minutes DF stretch x 3 holding 30 seconds 4 way ankle exercises with Level 2 band x 15 each Manual: PROM Rt ankle Modalities:  Vasopneumatic: 34 deg, medium compression, x 15 minutes     01/14/22:  Therex:    HEP instruction/performance c cues for techniques, handout provided.  Trial set performed of each for comprehension and symptom assessment.  See below for exercise list   PATIENT EDUCATION:  Education details: HEP, POC Person educated: Patient Education method: Explanation, Demonstration, Verbal cues, and Handouts Education comprehension: verbalized understanding, returned demonstration, and verbal cues required   HOME EXERCISE  PROGRAM: Access Code: [email or web address] URL: [email or web address] Date: 01/16/2022 Prepared by: Narda Amber  Exercises - Seated Ankle Alphabet  - 3 x daily - 7 x weekly - Towel Scrunches  - 2 x daily - 7 x weekly - 5 reps - Ankle Inversion Eversion Towel Slide  - 3 x daily - 7 x weekly - 10 reps - 2 seconds hold - Long Sitting Calf Stretch with Strap  - 3 x daily - 7 x weekly - 3 reps - 30 seconds hold - Ankle Dorsiflexion with Resistance  - 2 x daily - 7 x weekly - 2 sets - 10 reps - Ankle Eversion with Resistance  - 2 x daily - 7 x weekly - 2 sets - 10 reps - Ankle Inversion with Resistance  - 2 x daily - 7 x weekly - 2 sets - 10 reps - Ankle and Toe Plantarflexion with Resistance  - 2 x daily - 7 x weekly - 2 sets - 10 reps   ASSESSMENT:   CLINICAL IMPRESSION: *** Pt stating 3-4/10 pain upon arrival to therapy. Pt stating pain gets worse throughout the day. Pt stating her ankle feels more stiffness/tightness. Pt tolerating exercises well. Continue skilled PT and progress to more weight bearing activities as tolerated.    OBJECTIVE IMPAIRMENTS: Abnormal gait, decreased activity tolerance, decreased balance, decreased mobility, difficulty walking, decreased ROM, decreased strength, increased edema, and pain.    ACTIVITY LIMITATIONS: bending, sitting, standing, squatting, sleeping, transfers, and  dressing   PARTICIPATION LIMITATIONS: cleaning, driving, shopping, community activity, and occupation   PERSONAL FACTORS: 1-2 comorbidities: see pertinent history above  are also affecting patient's functional outcome.    REHAB POTENTIAL: Excellent   CLINICAL DECISION MAKING: Stable/uncomplicated   EVALUATION COMPLEXITY: Low     GOALS: Goals reviewed with patient? Yes   SHORT TERM GOALS: (target date for Short term goals are 3 weeks 02/08/22)    1.  Patient will demonstrate independent use of home exercise program to maintain progress from in clinic treatments.   Goal  status: New   LONG TERM GOALS: (target dates for all long term goals are 10 weeks  03/29/22 )   1. Patient will demonstrate/report pain at worst less than or equal to 2/10 to facilitate minimal limitation in daily activity secondary to pain symptoms.   Goal status: New   2. Patient will demonstrate independent use of home exercise program to facilitate ability to maintain/progress functional gains from skilled physical therapy services.   Goal status: New   3. Patient will demonstrate FOTO outcome > or = 66 % to indicate reduced disability due to condition.   Goal status: New   4.  Patient will demonstrate >/= 4 LE MMT throughout to faciltiate usual transfers, stairs, squatting at [facility] for daily life.    Goal status: New   5.  Patient will be able to amb community distance with no device with step through gait pattern without antalgic gait pattern.  Goal status: New   6.  Pt will be able to navigate up and down 1 flight of stairs with single hand rail with step over step pattern.  Goal status: New   7.  Pt will improve Rt ankle DF to >/= 15 degrees actively for improved gait and functional mobility.  Goal Status: New     PLAN:   PT FREQUENCY: 1-2x/week   PT DURATION: 10 weeks   PLANNED INTERVENTIONS: Therapeutic exercises, Therapeutic activity, Neuro Muscular re-education, Balance training, Gait training, Patient/Family education, Joint mobilization, Stair training, DME instructions, Dry Needling, Electrical stimulation, Traction, Cryotherapy, vasopneumatic deviceMoist heat, Taping, Ultrasound, Ionotophoresis 4mg /ml Dexamethasone, and Manual therapy.  All included unless contraindicated   PLAN FOR NEXT SESSION: *** ankle ROM, strengthening, begin weight bearing as tolerated, Nustep        Jamey Reas, PT, DPT 01/23/2022, 9:09 AM

## 2022-01-29 ENCOUNTER — Encounter: Payer: Self-pay | Admitting: Physical Therapy

## 2022-01-29 ENCOUNTER — Ambulatory Visit (INDEPENDENT_AMBULATORY_CARE_PROVIDER_SITE_OTHER): Payer: Managed Care, Other (non HMO) | Admitting: Physical Therapy

## 2022-01-29 DIAGNOSIS — M25671 Stiffness of right ankle, not elsewhere classified: Secondary | ICD-10-CM | POA: Diagnosis not present

## 2022-01-29 DIAGNOSIS — R262 Difficulty in walking, not elsewhere classified: Secondary | ICD-10-CM | POA: Diagnosis not present

## 2022-01-29 DIAGNOSIS — R600 Localized edema: Secondary | ICD-10-CM

## 2022-01-29 DIAGNOSIS — M25571 Pain in right ankle and joints of right foot: Secondary | ICD-10-CM | POA: Diagnosis not present

## 2022-01-29 NOTE — Therapy (Addendum)
OUTPATIENT PHYSICAL THERAPY TREATMENT NOTE   Patient Name: Elizabeth Perry MRN: [id number] DOB:10-12-1970, 52 y.o., female Today's Date: 01/29/2022  PCP: Lawerance Cruel, MD  REFERRING PROVIDER: Newt Minion, MD    END OF SESSION:   PT End of Session - 01/29/22 0930     Visit Number 3    Number of Visits 20    Date for PT Re-Evaluation 03/29/22    PT Start Time 0845    PT Stop Time 0935    PT Time Calculation (min) 50 min    Activity Tolerance Patient tolerated treatment well;Patient limited by pain    Behavior During Therapy [facility] for tasks assessed/performed              Past Medical History:  Diagnosis Date   Asthma    Headache    Hypertension    Past Surgical History:  Procedure Laterality Date   COLONOSCOPY     MOUTH SURGERY     ORIF ANKLE FRACTURE Right 12/28/2021   Procedure: OPEN REDUCTION INTERNAL FIXATION (ORIF) RIGHT ANKLE FRACTURE;  Surgeon: Newt Minion, MD;  Location: [place];  Service: Orthopedics;  Laterality: Right;   Patient Active Problem List   Diagnosis Date Noted   Closed fracture of right ankle 12/28/2021    REFERRING DIAG:  Z98.890,Z87.81 (ICD-10-CM) - S/P ORIF (open reduction internal fixation) fracture   THERAPY DIAG:  Acute right ankle pain  Stiffness of right ankle, not elsewhere classified  Difficulty in walking, not elsewhere classified  Localized edema  Rationale for Evaluation and Treatment Rehabilitation  PERTINENT HISTORY: Asthma, HA, HTN   PRECAUTIONS: none  SUBJECTIVE:                                                                                                                                                                                      SUBJECTIVE STATEMENT:   Pt arriving today with 5/10 pain in her Rt foot/ankle. Pt amb with bilateral axillary crtuches.    PAIN:  Are you having pain? Yes, 5/10 pain in Rt ankle/foot, tightness noted.    OBJECTIVE: (objective measures completed at initial  evaluation unless otherwise dated)  DIAGNOSTIC FINDINGS:  01/10/22 Narrative:  Three-view radiographs of the right ankle shows stable internal fixation of the Weber B fibular fracture.  The mortise is congruent.    PATIENT SURVEYS:  01/14/22: FOTO intake:   36%   COGNITION: Overall cognitive status: WFL                  SENSATION: 01/14/22: WFL   EDEMA:  01/14/22:  Circumferential: Rt: 29 centimeters around malleoli Left: 23 centimeters  POSTURE: No Significant postural limitations   PALPATION: TTP; lateral ankle around lateral malleleus   LOWER EXTREMITY ROM:    ROM Right eval Left eval Rt 01/29/22  Hip flexion       Hip extension       Hip abduction       Hip adduction       Hip internal rotation       Hip external rotation       Knee flexion       Knee extension       Ankle dorsiflexion A: 0  P: 4 A: 15 P: 20 A: 4  Ankle plantarflexion A: 12 P: 15 A: 25 A: 16  Ankle inversion A: 10 P: 12 A: 30 A: 14  Ankle eversion A: 14 P: 18 A: 34 A: 16   (Blank rows = not tested)   LOWER EXTREMITY MMT:   MMT Right eval Left eval  Hip flexion 5/5 5/5  Hip extension      Hip abduction 5/5 5/5  Hip adduction 5/5 5/5  Hip internal rotation      Hip external rotation      Knee flexion 4/5 5/5  Knee extension 4/5 5/5  Ankle dorsiflexion 2/5 5/5  Ankle plantarflexion 2/5 5/5  Ankle inversion 2/5 5/5  Ankle eversion 2/5 5/5   (Blank rows = not tested)     GAIT: Distance walked: 3 feet  Assistive device utilized: CAM boot on Rt Level of assistance: Modified independence Comments: antalgic gait pattern with UE support on mat table     TODAY'S TREATMENT                                                                          DATE: 01/29/22: TherEx:  Recumbent bike: Level 1 x 6 minutes Standing rocker board df/pf x 2 minutes and inv/eversion x 2 minutes DF stretch x 3 holding 30 seconds on slant board 4 way ankle exercises with Level 2 band x 15  each Standing on Airex mat feet apart, feet together, staggered stance each foot forward all x 30 sec with no UE support BAPS board: Level 2  x 1 minute each direction Manual: PROM Rt ankle Modalities:  Vasopneumatic: 34 deg, medium compression, x 10 minutes    01/16/22: TherEx:  rocker board df/pf x 2 minutes and inv/eversion x 2 minutes marble pick up x 4 minutes DF stretch x 3 holding 30 seconds 4 way ankle exercises with Level 2 band x 15 each Manual: PROM Rt ankle Modalities:  Vasopneumatic: 34 deg, medium compression, x 15 minutes     01/14/22:  Therex:    HEP instruction/performance c cues for techniques, handout provided.  Trial set performed of each for comprehension and symptom assessment.  See below for exercise list   PATIENT EDUCATION:  Education details: HEP, POC Person educated: Patient Education method: Explanation, Demonstration, Verbal cues, and Handouts Education comprehension: verbalized understanding, returned demonstration, and verbal cues required   HOME EXERCISE PROGRAM: Access Code: [email or web address] URL: [email or web address] Date: 01/16/2022 Prepared by: Kearney Hard  Exercises - Seated Ankle Alphabet  - 3 x daily - 7 x weekly - Towel Scrunches  - 2 x daily - 7 x weekly -  5 reps - Ankle Inversion Eversion Towel Slide  - 3 x daily - 7 x weekly - 10 reps - 2 seconds hold - Long Sitting Calf Stretch with Strap  - 3 x daily - 7 x weekly - 3 reps - 30 seconds hold - Ankle Dorsiflexion with Resistance  - 2 x daily - 7 x weekly - 2 sets - 10 reps - Ankle Eversion with Resistance  - 2 x daily - 7 x weekly - 2 sets - 10 reps - Ankle Inversion with Resistance  - 2 x daily - 7 x weekly - 2 sets - 10 reps - Ankle and Toe Plantarflexion with Resistance  - 2 x daily - 7 x weekly - 2 sets - 10 reps   ASSESSMENT:   CLINICAL IMPRESSION: Pt stating pain is 5/10 in her Rt foot/ankle. Pt amb with bilateral axillary crutches progressing to single  crutch in her left UE. Pt edu in heel to toe gait pattern. Pt performing more standing activities and wt bearing on her Rt LE. Continue skilled PT to maximize pt's functions.    OBJECTIVE IMPAIRMENTS: Abnormal gait, decreased activity tolerance, decreased balance, decreased mobility, difficulty walking, decreased ROM, decreased strength, increased edema, and pain.    ACTIVITY LIMITATIONS: bending, sitting, standing, squatting, sleeping, transfers, and dressing   PARTICIPATION LIMITATIONS: cleaning, driving, shopping, community activity, and occupation   PERSONAL FACTORS: 1-2 comorbidities: see pertinent history above  are also affecting patient's functional outcome.    REHAB POTENTIAL: Excellent   CLINICAL DECISION MAKING: Stable/uncomplicated   EVALUATION COMPLEXITY: Low     GOALS: Goals reviewed with patient? Yes   SHORT TERM GOALS: (target date for Short term goals are 3 weeks 02/08/22)    1.  Patient will demonstrate independent use of home exercise program to maintain progress from in clinic treatments.   Goal status: MET 01/29/22   LONG TERM GOALS: (target dates for all long term goals are 10 weeks  03/29/22 )   1. Patient will demonstrate/report pain at worst less than or equal to 2/10 to facilitate minimal limitation in daily activity secondary to pain symptoms.   Goal status: New   2. Patient will demonstrate independent use of home exercise program to facilitate ability to maintain/progress functional gains from skilled physical therapy services.   Goal status: New   3. Patient will demonstrate FOTO outcome > or = 66 % to indicate reduced disability due to condition.   Goal status: New   4.  Patient will demonstrate >/= 4 LE MMT throughout to faciltiate usual transfers, stairs, squatting at [facility] for daily life.    Goal status: New   5.  Patient will be able to amb community distance with no device with step through gait pattern without antalgic gait pattern.  Goal  status: New   6.  Pt will be able to navigate up and down 1 flight of stairs with single hand rail with step over step pattern.  Goal status: New   7.  Pt will improve Rt ankle DF to >/= 15 degrees actively for improved gait and functional mobility.  Goal Status: New     PLAN:   PT FREQUENCY: 1-2x/week   PT DURATION: 10 weeks   PLANNED INTERVENTIONS: Therapeutic exercises, Therapeutic activity, Neuro Muscular re-education, Balance training, Gait training, Patient/Family education, Joint mobilization, Stair training, DME instructions, Dry Needling, Electrical stimulation, Traction, Cryotherapy, vasopneumatic deviceMoist heat, Taping, Ultrasound, Ionotophoresis 5m/ml Dexamethasone, and Manual therapy.  All included unless contraindicated  PLAN FOR NEXT SESSION: ankle ROM, strengthening, begin weight bearing as tolerated progress from crutch to no device, Nustep vs bike,         Oretha Caprice, PT, MPT 01/29/2022, 9:32 AM

## 2022-01-30 NOTE — Therapy (Signed)
OUTPATIENT PHYSICAL THERAPY TREATMENT NOTE   Patient Name: Elizabeth Perry MRN: [id number] DOB:[birth year], 52 y.o., female Today's Date: 01/31/2022  PCP: Lawerance Cruel, MD  REFERRING PROVIDER: Newt Minion, MD    END OF SESSION:   PT End of Session - 01/31/22 0810     Visit Number 4    Number of Visits 20    Date for PT Re-Evaluation 03/29/22    PT Start Time 0807    PT Stop Time 0851    PT Time Calculation (min) 44 min    Activity Tolerance Patient tolerated treatment well;Patient limited by pain    Behavior During Therapy [facility] for tasks assessed/performed               Past Medical History:  Diagnosis Date   Asthma    Headache    Hypertension    Past Surgical History:  Procedure Laterality Date   COLONOSCOPY     MOUTH SURGERY     ORIF ANKLE FRACTURE Right 12/28/2021   Procedure: OPEN REDUCTION INTERNAL FIXATION (ORIF) RIGHT ANKLE FRACTURE;  Surgeon: Newt Minion, MD;  Location: [place];  Service: Orthopedics;  Laterality: Right;   Patient Active Problem List   Diagnosis Date Noted   Closed fracture of right ankle 12/28/2021    REFERRING DIAG:  Z98.890,Z87.81 (ICD-10-CM) - S/P ORIF (open reduction internal fixation) fracture   THERAPY DIAG:  Acute right ankle pain  Stiffness of right ankle, not elsewhere classified  Difficulty in walking, not elsewhere classified  Localized edema  Rationale for Evaluation and Treatment Rehabilitation  PERTINENT HISTORY: Asthma, HA, HTN   PRECAUTIONS: none  SUBJECTIVE:                                                                                                                                                                                      SUBJECTIVE STATEMENT:   She has been walking in house without boot but not standing or community walking without boot.  PAIN:  Are you having pain? Yes, today 1/10  and over last week 1/10 - 5/10 pain in Rt ankle/foot, tightness noted.   OBJECTIVE: (objective  measures completed at initial evaluation unless otherwise dated)  DIAGNOSTIC FINDINGS:  01/10/22 Narrative:  Three-view radiographs of the right ankle shows stable internal fixation of the Weber B fibular fracture.  The mortise is congruent.    PATIENT SURVEYS:  01/14/22: FOTO intake:   36%   COGNITION: Overall cognitive status: WFL                  SENSATION: 01/14/22: WFL   EDEMA:  01/14/22:  Circumferential: Rt: 29 centimeters around malleoli  Left: 23 centimeters        POSTURE: No Significant postural limitations   PALPATION: TTP; lateral ankle around lateral malleleus   LOWER EXTREMITY ROM:    ROM Right eval Left eval Rt 01/29/22  Hip flexion       Hip extension       Hip abduction       Hip adduction       Hip internal rotation       Hip external rotation       Knee flexion       Knee extension       Ankle dorsiflexion A: 0  P: 4 A: 15 P: 20 A: 4  Ankle plantarflexion A: 12 P: 15 A: 25 A: 16  Ankle inversion A: 10 P: 12 A: 30 A: 14  Ankle eversion A: 14 P: 18 A: 34 A: 16   (Blank rows = not tested)   LOWER EXTREMITY MMT:   MMT Right eval Left eval  Hip flexion 5/5 5/5  Hip extension      Hip abduction 5/5 5/5  Hip adduction 5/5 5/5  Hip internal rotation      Hip external rotation      Knee flexion 4/5 5/5  Knee extension 4/5 5/5  Ankle dorsiflexion 2/5 5/5  Ankle plantarflexion 2/5 5/5  Ankle inversion 2/5 5/5  Ankle eversion 2/5 5/5   (Blank rows = not tested)     GAIT: Distance walked: 3 feet  Assistive device utilized: CAM boot on Rt Level of assistance: Modified independence Comments: antalgic gait pattern with UE support on mat table     TODAY'S TREATMENT                                                                          DATE: 01/31/2022: TherEx:  NuStep seat 5 level 5 with BLEs / BUEs for 6 min working on ankle DF closed chain range & pressure BAPS board: seated on 26" mat table  Level 2  x 10 reps 2 sets  each  direction DF/PF, inv/ever/ CW/CCW circles ankle exercises with Level 2 red band x 10 each PF, PF w/inv, PF w/ever, DF, DF w/inv, DF w/ever Standing on Airex mat feet apart with no UE support, static 30 sec, eyes closed 10 sec X 3 and head motions eyes open. Working on facilitating ankle strategy for stabilization.   Gait Training:  PT instructed in step thru pattern & proper ankle motion with 2 crutches with shoe. Pt amb 50' with cues.  PT adjusted pt's crutch for her ht & arm length.  PT recommended using 2 crutches if walking without boot in house and 1 crutch with boot outside house until she sees Dr. Sharol Given on 1/11. He may want her in an ASO as transition to protect ankle.      01/16/22: TherEx:  Recumbent bike: Level 1 x 6 minutes Standing rocker board df/pf x 2 minutes and inv/eversion x 2 minutes DF stretch x 3 holding 30 seconds on slant board 4 way ankle exercises with Level 2 band x 15 each Standing on Airex mat feet apart, feet together, staggered stance each foot forward all x 30 sec with no UE support  BAPS board: Level 2  x 1 minute each direction Manual: PROM Rt ankle Modalities:  Vasopneumatic: 34 deg, medium compression, x 10 minutes    01/16/22: TherEx:  rocker board df/pf x 2 minutes and inv/eversion x 2 minutes marble pick up x 4 minutes DF stretch x 3 holding 30 seconds 4 way ankle exercises with Level 2 band x 15 each Manual: PROM Rt ankle Modalities:  Vasopneumatic: 34 deg, medium compression, x 15 minutes     PATIENT EDUCATION:  Education details: HEP, POC Person educated: Patient Education method: Consulting civil engineer, Demonstration, Verbal cues, and Handouts Education comprehension: verbalized understanding, returned demonstration, and verbal cues required   HOME EXERCISE PROGRAM: Access Code: [email or web address] URL: [email or web address] Date: 01/16/2022 Prepared by: Kearney Hard  Exercises - Seated Ankle Alphabet  - 3 x daily - 7 x weekly -  Towel Scrunches  - 2 x daily - 7 x weekly - 5 reps - Ankle Inversion Eversion Towel Slide  - 3 x daily - 7 x weekly - 10 reps - 2 seconds hold - Long Sitting Calf Stretch with Strap  - 3 x daily - 7 x weekly - 3 reps - 30 seconds hold - Ankle Dorsiflexion with Resistance  - 2 x daily - 7 x weekly - 2 sets - 10 reps - Ankle Eversion with Resistance  - 2 x daily - 7 x weekly - 2 sets - 10 reps - Ankle Inversion with Resistance  - 2 x daily - 7 x weekly - 2 sets - 10 reps - Ankle and Toe Plantarflexion with Resistance  - 2 x daily - 7 x weekly - 2 sets - 10 reps   ASSESSMENT:   CLINICAL IMPRESSION: Patient's pain is improving but still limits some activities.  PT recommends 2 crutches without CAM walking boot only indoors with even surfaces and 1 crutch with boot for out of house until she sees Dr. Sharol Given 1/11.  She may need an ASO - ankle stabilizing orthosis as transition to protect ankle from sprain or sudden motions initially.  Each new activity caused a spike in pain but seemed to settle once she performed additional reps.     OBJECTIVE IMPAIRMENTS: Abnormal gait, decreased activity tolerance, decreased balance, decreased mobility, difficulty walking, decreased ROM, decreased strength, increased edema, and pain.    ACTIVITY LIMITATIONS: bending, sitting, standing, squatting, sleeping, transfers, and dressing   PARTICIPATION LIMITATIONS: cleaning, driving, shopping, community activity, and occupation   PERSONAL FACTORS: 1-2 comorbidities: see pertinent history above  are also affecting patient's functional outcome.    REHAB POTENTIAL: Excellent   CLINICAL DECISION MAKING: Stable/uncomplicated   EVALUATION COMPLEXITY: Low     GOALS: Goals reviewed with patient? Yes   SHORT TERM GOALS: (target date for Short term goals are 3 weeks 02/08/22)    1.  Patient will demonstrate independent use of home exercise program to maintain progress from in clinic treatments.   Goal status: MET  01/29/22   LONG TERM GOALS: (target dates for all long term goals are 10 weeks  03/29/22 )   1. Patient will demonstrate/report pain at worst less than or equal to 2/10 to facilitate minimal limitation in daily activity secondary to pain symptoms.   Goal status: New   2. Patient will demonstrate independent use of home exercise program to facilitate ability to maintain/progress functional gains from skilled physical therapy services.   Goal status: New   3. Patient will demonstrate FOTO outcome > or = 66 % to indicate reduced disability  due to condition.   Goal status: New   4.  Patient will demonstrate >/= 4 LE MMT throughout to faciltiate usual transfers, stairs, squatting at [facility] for daily life.    Goal status: New   5.  Patient will be able to amb community distance with no device with step through gait pattern without antalgic gait pattern.  Goal status: New   6.  Pt will be able to navigate up and down 1 flight of stairs with single hand rail with step over step pattern.  Goal status: New   7.  Pt will improve Rt ankle DF to >/= 15 degrees actively for improved gait and functional mobility.  Goal Status: New     PLAN:   PT FREQUENCY: 1-2x/week   PT DURATION: 10 weeks   PLANNED INTERVENTIONS: Therapeutic exercises, Therapeutic activity, Neuro Muscular re-education, Balance training, Gait training, Patient/Family education, Joint mobilization, Stair training, DME instructions, Dry Needling, Electrical stimulation, Traction, Cryotherapy, vasopneumatic deviceMoist heat, Taping, Ultrasound, Ionotophoresis 29m/ml Dexamethasone, and Manual therapy.  All included unless contraindicated   PLAN FOR NEXT SESSION: send note to Dr. DSharol Givenprior to 1/11 appt,  ankle ROM, strengthening, begin weight bearing stationary, NCordie Grice PT, DPT 01/31/2022, 10:33 AM

## 2022-01-31 ENCOUNTER — Encounter: Payer: Self-pay | Admitting: Physical Therapy

## 2022-01-31 ENCOUNTER — Ambulatory Visit: Payer: Managed Care, Other (non HMO) | Admitting: Physical Therapy

## 2022-01-31 DIAGNOSIS — R262 Difficulty in walking, not elsewhere classified: Secondary | ICD-10-CM | POA: Diagnosis not present

## 2022-01-31 DIAGNOSIS — M25571 Pain in right ankle and joints of right foot: Secondary | ICD-10-CM | POA: Diagnosis not present

## 2022-01-31 DIAGNOSIS — M25671 Stiffness of right ankle, not elsewhere classified: Secondary | ICD-10-CM | POA: Diagnosis not present

## 2022-01-31 DIAGNOSIS — R600 Localized edema: Secondary | ICD-10-CM

## 2022-02-05 ENCOUNTER — Ambulatory Visit: Payer: Managed Care, Other (non HMO) | Admitting: Physical Therapy

## 2022-02-05 ENCOUNTER — Encounter: Payer: Self-pay | Admitting: Physical Therapy

## 2022-02-05 DIAGNOSIS — R262 Difficulty in walking, not elsewhere classified: Secondary | ICD-10-CM

## 2022-02-05 DIAGNOSIS — R600 Localized edema: Secondary | ICD-10-CM

## 2022-02-05 DIAGNOSIS — M25571 Pain in right ankle and joints of right foot: Secondary | ICD-10-CM

## 2022-02-05 DIAGNOSIS — M25671 Stiffness of right ankle, not elsewhere classified: Secondary | ICD-10-CM

## 2022-02-05 NOTE — Therapy (Signed)
OUTPATIENT PHYSICAL THERAPY TREATMENT NOTE & PROGRESS NOTE   Patient Name: Elizabeth Perry MRN: [id number] DOB:Aug 19, 1970, 52 y.o., female Today's Date: 02/05/2022  PCP: Daisy Floro, MD  REFERRING PROVIDER: [name], MD    END OF SESSION:   PT End of Session - 02/05/22 1432     Visit Number 5    Number of Visits 20    Date for PT Re-Evaluation 03/29/22    PT Start Time 1430    PT Stop Time 1529    PT Time Calculation (min) 59 min    Activity Tolerance Patient tolerated treatment well;Patient limited by pain    Behavior During Therapy [facility] for tasks assessed/performed                Past Medical History:  Diagnosis Date   Asthma    Headache    Hypertension    Past Surgical History:  Procedure Laterality Date   COLONOSCOPY     MOUTH SURGERY     ORIF ANKLE FRACTURE Right 12/28/2021   Procedure: OPEN REDUCTION INTERNAL FIXATION (ORIF) RIGHT ANKLE FRACTURE;  Surgeon: [name], MD;  Location: [facility] OR;  Service: Orthopedics;  Laterality: Right;   Patient Active Problem List   Diagnosis Date Noted   Closed fracture of right ankle 12/28/2021    REFERRING DIAG:  Z98.890,Z87.81 (ICD-10-CM) - S/P ORIF (open reduction internal fixation) fracture   THERAPY DIAG:  Acute right ankle pain  Stiffness of right ankle, not elsewhere classified  Difficulty in walking, not elsewhere classified  Localized edema  Rationale for Evaluation and Treatment Rehabilitation  PERTINENT HISTORY: Asthma, HA, HTN   PRECAUTIONS: none  SUBJECTIVE:                                                                                                                                                                                      SUBJECTIVE STATEMENT:   She has been walking with 2 crutches in house working on step through pattern.   PAIN:  Are you having pain? Yes, today 0-1/10  and over last week 0/10 - 5/10 pain in Rt ankle/foot, tightness noted.   OBJECTIVE:  (objective measures completed at initial evaluation unless otherwise dated)  DIAGNOSTIC FINDINGS:  01/10/22 Narrative:  Three-view radiographs of the right ankle shows stable internal fixation of the Weber B fibular fracture.  The mortise is congruent.    PATIENT SURVEYS:  01/14/22: FOTO intake:   36%   COGNITION: Overall cognitive status: WFL                  SENSATION: 01/14/22: WFL   EDEMA:  01/14/22:  Circumferential: Rt: 29  centimeters around malleoli Left: 23 centimeters        POSTURE: No Significant postural limitations   PALPATION: TTP; lateral ankle around lateral malleleus   LOWER EXTREMITY ROM:    ROM Right eval Left eval Rt 01/29/22 Right 02/05/22  Hip flexion        Hip extension        Hip abduction        Hip adduction        Hip internal rotation        Hip external rotation        Knee flexion        Knee extension        Ankle dorsiflexion A: 0  P: 4 A: 15 P: 20 A: 4 Seated Knee ext P: 20* A: 5* Knee flex P: 22* A: 8*  Ankle plantarflexion A: 12 P: 15 A: 25 A: 16 Seated P: 26* A: 18*  Ankle inversion A: 10 P: 12 A: 30 A: 14 Seated With forefoot add P: 32* A: 22* Prone Hindfoot P:12*  Ankle eversion A: 14 P: 18 A: 34 A: 16 Seated forefoot abd P: 22* A: 20* Prone hindfoot 6*   (Blank rows = not tested)   LOWER EXTREMITY MMT:   MMT Right eval Left eval  Hip flexion 5/5 5/5  Hip extension      Hip abduction 5/5 5/5  Hip adduction 5/5 5/5  Hip internal rotation      Hip external rotation      Knee flexion 4/5 5/5  Knee extension 4/5 5/5  Ankle dorsiflexion 2/5 5/5  Ankle plantarflexion 2/5 5/5  Ankle inversion 2/5 5/5  Ankle eversion 2/5 5/5   (Blank rows = not tested)     GAIT: Distance walked: 3 feet  Assistive device utilized: CAM boot on Rt Level of assistance: Modified independence Comments: antalgic gait pattern with UE support on mat table     TODAY'S TREATMENT                                                                           DATE: 02/05/2022: TherEx:  NuStep seat 5 level 5 with BLEs / BUEs for 8 min working on ankle DF closed chain range & pressure BAPS board: seated on 26" mat table  Level 2  x 10 reps 2 sets  each direction DF/PF, inv/ever/ CW/CCW circles ankle exercises with Level 2 red band x 10 each PF, PF w/inv, PF w/ever, DF, DF w/inv, DF w/ever Standing on Airex mat feet apart with no UE support, static 30 sec eyes closed and head motions eyes closed. Working on facilitating ankle strategy for stabilization.  Standing on AirEx mat HHA DF/PF 10 reps  Gait Training:  Pt amb with 2 crutches with step through pattern. 50' & 100'. Stairs step-to pattern modified to protect right ankle PT verbal cues. Pt neg 6 steps with rail & crutch with supervision.  Manual: PROM Rt ankle Modalities:  Vasopneumatic: 34 deg, medium compression, x 10 minutes  01/31/2022: TherEx:  NuStep seat 5 level 5 with BLEs / BUEs for 6 min working on ankle DF closed chain range & pressure BAPS board: seated on 26" mat table  Level  2  x 10 reps 2 sets  each direction DF/PF, inv/ever/ CW/CCW circles ankle exercises with Level 2 red band x 10 each PF, PF w/inv, PF w/ever, DF, DF w/inv, DF w/ever Standing on Airex mat feet apart with no UE support, static 30 sec, eyes closed 10 sec X 3 and head motions eyes open. Working on facilitating ankle strategy for stabilization.   Gait Training:  PT instructed in step thru pattern & proper ankle motion with 2 crutches with shoe. Pt amb 50' with cues.  PT adjusted pt's crutch for her ht & arm length.  PT recommended using 2 crutches if walking without boot in house and 1 crutch with boot outside house until she sees Dr. Sharol Given on 1/11. He may want her in an ASO as transition to protect ankle.      01/29/2022: TherEx:  Recumbent bike: Level 1 x 6 minutes Standing rocker board df/pf x 2 minutes and inv/eversion x 2 minutes DF stretch x 3 holding 30 seconds on slant  board 4 way ankle exercises with Level 2 band x 15 each Standing on Airex mat feet apart, feet together, staggered stance each foot forward all x 30 sec with no UE support BAPS board: Level 2  x 1 minute each direction Manual: PROM Rt ankle Modalities:  Vasopneumatic: 34 deg, medium compression, x 10 minutes     PATIENT EDUCATION:  Education details: HEP, POC Person educated: Patient Education method: Consulting civil engineer, Demonstration, Verbal cues, and Handouts Education comprehension: verbalized understanding, returned demonstration, and verbal cues required   HOME EXERCISE PROGRAM: Access Code: [email or web address] URL: [email or web address] Date: 01/16/2022 Prepared by: Kearney Hard  Exercises - Seated Ankle Alphabet  - 3 x daily - 7 x weekly - Towel Scrunches  - 2 x daily - 7 x weekly - 5 reps - Ankle Inversion Eversion Towel Slide  - 3 x daily - 7 x weekly - 10 reps - 2 seconds hold - Long Sitting Calf Stretch with Strap  - 3 x daily - 7 x weekly - 3 reps - 30 seconds hold - Ankle Dorsiflexion with Resistance  - 2 x daily - 7 x weekly - 2 sets - 10 reps - Ankle Eversion with Resistance  - 2 x daily - 7 x weekly - 2 sets - 10 reps - Ankle Inversion with Resistance  - 2 x daily - 7 x weekly - 2 sets - 10 reps - Ankle and Toe Plantarflexion with Resistance  - 2 x daily - 7 x weekly - 2 sets - 10 reps   ASSESSMENT:   CLINICAL IMPRESSION:  Patient is improving with motion but is still limited in range, strength, standing / gait function and edema of right ankle.  She may need an ASO - ankle stabilizing orthosis as transition to protect ankle from sprain or sudden motions initially.  Each new activity caused a spike in pain but seemed to settle once she performed additional reps.  She continues to benefit from skilled PT    OBJECTIVE IMPAIRMENTS: Abnormal gait, decreased activity tolerance, decreased balance, decreased mobility, difficulty walking, decreased ROM, decreased strength,  increased edema, and pain.    ACTIVITY LIMITATIONS: bending, sitting, standing, squatting, sleeping, transfers, and dressing   PARTICIPATION LIMITATIONS: cleaning, driving, shopping, community activity, and occupation   PERSONAL FACTORS: 1-2 comorbidities: see pertinent history above  are also affecting patient's functional outcome.    REHAB POTENTIAL: Excellent   CLINICAL DECISION MAKING: Stable/uncomplicated   EVALUATION COMPLEXITY: Low  GOALS: Goals reviewed with patient? Yes   SHORT TERM GOALS: (target date for Short term goals are 3 weeks 02/08/22)    1.  Patient will demonstrate independent use of home exercise program to maintain progress from in clinic treatments.   Goal status: MET 01/29/22   LONG TERM GOALS: (target dates for all long term goals are 10 weeks  03/29/22 )   1. Patient will demonstrate/report pain at worst less than or equal to 2/10 to facilitate minimal limitation in daily activity secondary to pain symptoms.   Goal status: New   2. Patient will demonstrate independent use of home exercise program to facilitate ability to maintain/progress functional gains from skilled physical therapy services.   Goal status: New   3. Patient will demonstrate FOTO outcome > or = 66 % to indicate reduced disability due to condition.   Goal status: New   4.  Patient will demonstrate >/= 4 LE MMT throughout to faciltiate usual transfers, stairs, squatting at [facility] for daily life.    Goal status: New   5.  Patient will be able to amb community distance with no device with step through gait pattern without antalgic gait pattern.  Goal status: New   6.  Pt will be able to navigate up and down 1 flight of stairs with single hand rail with step over step pattern.  Goal status: New   7.  Pt will improve Rt ankle DF to >/= 15 degrees actively for improved gait and functional mobility.  Goal Status: New     PLAN:   PT FREQUENCY: 1-2x/week   PT DURATION: 10 weeks    PLANNED INTERVENTIONS: Therapeutic exercises, Therapeutic activity, Neuro Muscular re-education, Balance training, Gait training, Patient/Family education, Joint mobilization, Stair training, DME instructions, Dry Needling, Electrical stimulation, Traction, Cryotherapy, vasopneumatic deviceMoist heat, Taping, Ultrasound, Ionotophoresis 4mg /ml Dexamethasone, and Manual therapy.  All included unless contraindicated   PLAN FOR NEXT SESSION: check note for Dr. 1/11 appt,  if she gets ASO begin gait with cane.  ankle ROM, strengthening, begin weight bearing stationary, 3/11, PT, DPT 02/05/2022, 3:49 PM

## 2022-02-07 ENCOUNTER — Encounter: Payer: Self-pay | Admitting: Physical Therapy

## 2022-02-07 ENCOUNTER — Ambulatory Visit: Payer: Managed Care, Other (non HMO) | Admitting: Orthopedic Surgery

## 2022-02-07 ENCOUNTER — Encounter: Payer: Self-pay | Admitting: Orthopedic Surgery

## 2022-02-07 ENCOUNTER — Ambulatory Visit: Payer: Managed Care, Other (non HMO) | Admitting: Physical Therapy

## 2022-02-07 DIAGNOSIS — M25571 Pain in right ankle and joints of right foot: Secondary | ICD-10-CM | POA: Diagnosis not present

## 2022-02-07 DIAGNOSIS — R262 Difficulty in walking, not elsewhere classified: Secondary | ICD-10-CM

## 2022-02-07 DIAGNOSIS — M25671 Stiffness of right ankle, not elsewhere classified: Secondary | ICD-10-CM

## 2022-02-07 DIAGNOSIS — Z8781 Personal history of (healed) traumatic fracture: Secondary | ICD-10-CM

## 2022-02-07 DIAGNOSIS — R600 Localized edema: Secondary | ICD-10-CM

## 2022-02-07 DIAGNOSIS — Z9889 Other specified postprocedural states: Secondary | ICD-10-CM

## 2022-02-07 NOTE — Therapy (Signed)
OUTPATIENT PHYSICAL THERAPY TREATMENT NOTE   Patient Name: Elizabeth Perry MRN: [id number] DOB:Oct 15, 1970, 52 y.o., female Today's Date: 02/07/2022  PCP: Daisy Floro, MD  REFERRING PROVIDER: [name], MD    END OF SESSION:   PT End of Session - 02/07/22 1005     Visit Number 6    Number of Visits 20    Date for PT Re-Evaluation 03/29/22    Authorization Type [employer] Managed    Authorization Time Period $60 co-pay, 30 visit limit    Authorization - Visit Number 4   reset for 2024   Authorization - Number of Visits 30    Progress Note Due on Visit 15    PT Start Time 1007    PT Stop Time 1108    PT Time Calculation (min) 61 min    Activity Tolerance Patient tolerated treatment well;Patient limited by pain    Behavior During Therapy [facility] for tasks assessed/performed                Past Medical History:  Diagnosis Date   Asthma    Headache    Hypertension    Past Surgical History:  Procedure Laterality Date   COLONOSCOPY     MOUTH SURGERY     ORIF ANKLE FRACTURE Right 12/28/2021   Procedure: OPEN REDUCTION INTERNAL FIXATION (ORIF) RIGHT ANKLE FRACTURE;  Surgeon: [name], MD;  Location: [facility] OR;  Service: Orthopedics;  Laterality: Right;   Patient Active Problem List   Diagnosis Date Noted   Closed fracture of right ankle 12/28/2021    REFERRING DIAG:  Z98.890,Z87.81 (ICD-10-CM) - S/P ORIF (open reduction internal fixation) fracture   THERAPY DIAG:  Acute right ankle pain  Stiffness of right ankle, not elsewhere classified  Difficulty in walking, not elsewhere classified  Localized edema  Rationale for Evaluation and Treatment Rehabilitation  PERTINENT HISTORY: Asthma, HA, HTN   PRECAUTIONS: none  SUBJECTIVE:                                                                                                                                                                                      SUBJECTIVE STATEMENT:   She saw Dr. Lajoyce Corners  prior to PT who said ankle looks good and issued ASO.   PAIN:  Are you having pain? Yes, today 0/10  and over last week 0/10 - 5/10 pain in Rt ankle/foot, tightness noted.   OBJECTIVE: (objective measures completed at initial evaluation unless otherwise dated)  DIAGNOSTIC FINDINGS:  01/10/22 Narrative:  Three-view radiographs of the right ankle shows stable internal fixation of the Weber B fibular fracture.  The mortise is congruent.  PATIENT SURVEYS:  01/14/22: FOTO intake:   36%   COGNITION: Overall cognitive status: WFL                  SENSATION: 01/14/22: WFL   EDEMA:  01/14/22:  Circumferential: Rt: 29 centimeters around malleoli Left: 23 centimeters        POSTURE: No Significant postural limitations   PALPATION: TTP; lateral ankle around lateral malleleus   LOWER EXTREMITY ROM:    ROM Right eval Left eval Rt 01/29/22 Right 02/05/22  Hip flexion        Hip extension        Hip abduction        Hip adduction        Hip internal rotation        Hip external rotation        Knee flexion        Knee extension        Ankle dorsiflexion A: 0  P: 4 A: 15 P: 20 A: 4 Seated Knee ext P: 20* A: 5* Knee flex P: 22* A: 8*  Ankle plantarflexion A: 12 P: 15 A: 25 A: 16 Seated P: 26* A: 18*  Ankle inversion A: 10 P: 12 A: 30 A: 14 Seated With forefoot add P: 32* A: 22* Prone Hindfoot P:12*  Ankle eversion A: 14 P: 18 A: 34 A: 16 Seated forefoot abd P: 22* A: 20* Prone hindfoot 6*   (Blank rows = not tested)   LOWER EXTREMITY MMT:   MMT Right eval Left eval  Hip flexion 5/5 5/5  Hip extension      Hip abduction 5/5 5/5  Hip adduction 5/5 5/5  Hip internal rotation      Hip external rotation      Knee flexion 4/5 5/5  Knee extension 4/5 5/5  Ankle dorsiflexion 2/5 5/5  Ankle plantarflexion 2/5 5/5  Ankle inversion 2/5 5/5  Ankle eversion 2/5 5/5   (Blank rows = not tested)     GAIT: Distance walked: 3 feet  Assistive device  utilized: CAM boot on Rt Level of assistance: Modified independence Comments: antalgic gait pattern with UE support on mat table     TODAY'S TREATMENT                                                                          DATE: 02/07/2022: TherEx:  NuStep seat 5 level 5 with BLEs only for 8 min working on ankle DF closed chain range & pressure Standing on AirEx mat HHA DF/PF 15 reps light UE support Step up & down using RLE 4" step BUE support 15 reps. PT demo & verbal cues on technique.  BAPS board: standing BUE support  Level 1  x 10 reps 2 sets CW/CCW circles ankle exercises with Level 3 green band x 10 each PF, PF w/inv, PF w/ever, DF, DF w/inv, DF w/ever   Therapeutic Activities: PT reviewed instructions how to donne / doffe ASO.  PT recommended wearing ASO & shoes for all gait & standing activities.  If pain or edema increase then resume CAM boot or if going somewhere that would siginficantly increase her distance.  Pt amb 100' & neg ramp/curb with  cane with minor verbal cues. She appears safe to use cane for gait.  Discussed driving with braking esp quick would be issue initially. So may want to consider 2 foot driving.   Neuromuscular Re-ed: On AirEx mat static equal WBing 30 sec & tandem stance RLE in front / in back 30 sec.  Tapping cone for RLE stance 15 reps. Rocker board ant/level/post & right/level/left with intermittent UE touch 1 min ea motion.  Modalities:  Vasopneumatic: 34 deg, medium compression, x 10 minutes  02/05/2022: TherEx:  NuStep seat 5 level 5 with BLEs / BUEs for 8 min working on ankle DF closed chain range & pressure BAPS board: seated on 26" mat table  Level 2  x 10 reps 2 sets  each direction DF/PF, inv/ever/ CW/CCW circles ankle exercises with Level 2 red band x 10 each PF, PF w/inv, PF w/ever, DF, DF w/inv, DF w/ever Standing on Airex mat feet apart with no UE support, static 30 sec eyes closed and head motions eyes closed. Working on facilitating  ankle strategy for stabilization.  Standing on AirEx mat HHA DF/PF 10 reps  Gait Training:  Pt amb with 2 crutches with step through pattern. 50' & 100'. Stairs step-to pattern modified to protect right ankle PT verbal cues. Pt neg 6 steps with rail & crutch with supervision.  Manual: PROM Rt ankle  Modalities:  Vasopneumatic: 34 deg, medium compression, x 10 minutes  01/31/2022: TherEx:  NuStep seat 5 level 5 with BLEs / BUEs for 6 min working on ankle DF closed chain range & pressure BAPS board: seated on 26" mat table  Level 2  x 10 reps 2 sets  each direction DF/PF, inv/ever/ CW/CCW circles ankle exercises with Level 2 red band x 10 each PF, PF w/inv, PF w/ever, DF, DF w/inv, DF w/ever Standing on Airex mat feet apart with no UE support, static 30 sec, eyes closed 10 sec X 3 and head motions eyes open. Working on facilitating ankle strategy for stabilization.   Gait Training:  PT instructed in step thru pattern & proper ankle motion with 2 crutches with shoe. Pt amb 50' with cues.  PT adjusted pt's crutch for her ht & arm length.  PT recommended using 2 crutches if walking without boot in house and 1 crutch with boot outside house until she sees Dr. Lajoyce Corners on 1/11. He may want her in an ASO as transition to protect ankle.      PATIENT EDUCATION:  Education details: HEP, POC Person educated: Patient Education method: Programmer, multimedia, Demonstration, Verbal cues, and Handouts Education comprehension: verbalized understanding, returned demonstration, and verbal cues required   HOME EXERCISE PROGRAM: Access Code: [email or web address] URL: [email or web address] Date: 01/16/2022 Prepared by: Narda Amber  Exercises - Seated Ankle Alphabet  - 3 x daily - 7 x weekly - Towel Scrunches  - 2 x daily - 7 x weekly - 5 reps - Ankle Inversion Eversion Towel Slide  - 3 x daily - 7 x weekly - 10 reps - 2 seconds hold - Long Sitting Calf Stretch with Strap  - 3 x daily - 7 x weekly - 3 reps -  30 seconds hold - Ankle Dorsiflexion with Resistance  - 2 x daily - 7 x weekly - 2 sets - 10 reps - Ankle Eversion with Resistance  - 2 x daily - 7 x weekly - 2 sets - 10 reps - Ankle Inversion with Resistance  - 2 x daily - 7 x weekly - 2 sets -  10 reps - Ankle and Toe Plantarflexion with Resistance  - 2 x daily - 7 x weekly - 2 sets - 10 reps   ASSESSMENT:   CLINICAL IMPRESSION:  PT progressed standing activities today which she tolerated.  PT recommended over next week walking (cane outdoors) & standing with ASO and monitor pain / edema.  If no issues then progress HEP next week.     OBJECTIVE IMPAIRMENTS: Abnormal gait, decreased activity tolerance, decreased balance, decreased mobility, difficulty walking, decreased ROM, decreased strength, increased edema, and pain.    ACTIVITY LIMITATIONS: bending, sitting, standing, squatting, sleeping, transfers, and dressing   PARTICIPATION LIMITATIONS: cleaning, driving, shopping, community activity, and occupation   PERSONAL FACTORS: 1-2 comorbidities: see pertinent history above  are also affecting patient's functional outcome.    REHAB POTENTIAL: Excellent   CLINICAL DECISION MAKING: Stable/uncomplicated   EVALUATION COMPLEXITY: Low     GOALS: Goals reviewed with patient? Yes   SHORT TERM GOALS: (target date for Short term goals are 3 weeks 02/08/22)    1.  Patient will demonstrate independent use of home exercise program to maintain progress from in clinic treatments.   Goal status: MET 01/29/22   LONG TERM GOALS: (target dates for all long term goals are 10 weeks  03/29/22 )   1. Patient will demonstrate/report pain at worst less than or equal to 2/10 to facilitate minimal limitation in daily activity secondary to pain symptoms.   Goal status: New   2. Patient will demonstrate independent use of home exercise program to facilitate ability to maintain/progress functional gains from skilled physical therapy services.   Goal status:  New   3. Patient will demonstrate FOTO outcome > or = 66 % to indicate reduced disability due to condition.   Goal status: New   4.  Patient will demonstrate >/= 4 LE MMT throughout to faciltiate usual transfers, stairs, squatting at [facility] for daily life.    Goal status: New   5.  Patient will be able to amb community distance with no device with step through gait pattern without antalgic gait pattern.  Goal status: New   6.  Pt will be able to navigate up and down 1 flight of stairs with single hand rail with step over step pattern.  Goal status: New   7.  Pt will improve Rt ankle DF to >/= 15 degrees actively for improved gait and functional mobility.  Goal Status: New     PLAN:   PT FREQUENCY: 1-2x/week   PT DURATION: 10 weeks   PLANNED INTERVENTIONS: Therapeutic exercises, Therapeutic activity, Neuro Muscular re-education, Balance training, Gait training, Patient/Family education, Joint mobilization, Stair training, DME instructions, Dry Needling, Electrical stimulation, Traction, Cryotherapy, vasopneumatic deviceMoist heat, Taping, Ultrasound, Ionotophoresis 4mg /ml Dexamethasone, and Manual therapy.  All included unless contraindicated   PLAN FOR NEXT SESSION: standing & gait in clinic without ASO,  continue to progress mobility using ASO outside of PT for now to protect reinjury of ankle, standing exercises & balance.  Vaso to end.       Jamey Reas, PT, DPT 02/07/2022, 12:35 PM

## 2022-02-07 NOTE — Progress Notes (Signed)
Office Visit Note   Patient: Elizabeth Perry           Date of Birth: 1970/07/03           MRN: [id number] Visit Date: 02/07/2022              Requested by: Lawerance Cruel, [place],  [place] [address] PCP: Lawerance Cruel, MD  Chief Complaint  Patient presents with   Right Ankle - Routine Post Op    12/28/2021 right ankle ORIF      HPI: Patient is a 52 year old woman who presents 5 weeks status post open reduction internal fixation right ankle fracture.  She is currently weightbearing in a fracture boot doing therapy upstairs.  Assessment & Plan: Visit Diagnoses:  1. S/P ORIF (open reduction internal fixation) fracture     Plan: Continue with physical therapy we will advance her to an ASO.  Follow-up in 4 weeks.  Follow-Up Instructions: Return in about 4 weeks (around 03/07/2022).   Ortho Exam  Patient is alert, oriented, no adenopathy, well-dressed, normal affect, normal respiratory effort. Examination patient has good range of motion of her ankle and the incision is well-healed.  No pain to palpation.  Imaging: No results found. No images are attached to the encounter.  Labs: No results found for: "HGBA1C", "ESRSEDRATE", "CRP", "LABURIC", "REPTSTATUS", "GRAMSTAIN", "CULT", "LABORGA"   Lab Results  Component Value Date   ALBUMIN 3.9 11/28/2011    No results found for: "MG" No results found for: "VD25OH"  No results found for: "PREALBUMIN"    Latest Ref Rng & Units 12/28/2021    9:38 AM 11/28/2011   12:52 PM  CBC EXTENDED  WBC 4.0 - 10.5 K/uL 6.3  4.0   RBC 3.87 - 5.11 MIL/uL 4.64  4.63   Hemoglobin 12.0 - 15.0 g/dL 13.5  13.5   HCT 36.0 - 46.0 % 40.8  39.8   Platelets 150 - 400 K/uL 250  226   NEUT# 1.7 - 7.7 K/uL  1.4   Lymph# 0.7 - 4.0 K/uL  2.1      There is no height or weight on file to calculate BMI.  Orders:  No orders of the defined types were placed in this encounter.  No orders of the defined types were  placed in this encounter.    Procedures: No procedures performed  Clinical Data: No additional findings.  ROS:  All other systems negative, except as noted in the HPI. Review of Systems  Objective: Vital Signs: There were no vitals taken for this visit.  Specialty Comments:  No specialty comments available.  PMFS History: Patient Active Problem List   Diagnosis Date Noted   Closed fracture of right ankle 12/28/2021   Past Medical History:  Diagnosis Date   Asthma    Headache    Hypertension     Family History  Problem Relation Age of Onset   Hypertension Mother    Diabetes Mother    Diabetes Father    Hypertension Father    Hypertension Sister    Hypertension Brother     Past Surgical History:  Procedure Laterality Date   COLONOSCOPY     MOUTH SURGERY     ORIF ANKLE FRACTURE Right 12/28/2021   Procedure: OPEN REDUCTION INTERNAL FIXATION (ORIF) RIGHT ANKLE FRACTURE;  Surgeon: Newt Minion, MD;  Location: [facility];  Service: Orthopedics;  Laterality: Right;   Social History   Occupational History   Not on file  Tobacco Use   Smoking status: Former    Types: Cigarettes   Smokeless tobacco: Not on file  Vaping Use   Vaping Use: Never used  Substance and Sexual Activity   Alcohol use: No   Drug use: No   Sexual activity: Not on file

## 2022-02-11 ENCOUNTER — Ambulatory Visit: Payer: Managed Care, Other (non HMO) | Admitting: Physical Therapy

## 2022-02-11 ENCOUNTER — Encounter: Payer: Self-pay | Admitting: Physical Therapy

## 2022-02-11 DIAGNOSIS — M25671 Stiffness of right ankle, not elsewhere classified: Secondary | ICD-10-CM

## 2022-02-11 DIAGNOSIS — M25571 Pain in right ankle and joints of right foot: Secondary | ICD-10-CM

## 2022-02-11 DIAGNOSIS — R600 Localized edema: Secondary | ICD-10-CM | POA: Diagnosis not present

## 2022-02-11 DIAGNOSIS — R262 Difficulty in walking, not elsewhere classified: Secondary | ICD-10-CM | POA: Diagnosis not present

## 2022-02-11 NOTE — Therapy (Signed)
OUTPATIENT PHYSICAL THERAPY TREATMENT NOTE   Patient Name: Elizabeth Perry MRN: [id number] DOB:04-22-1970, 52 y.o., female Today's Date: 02/11/2022  PCP: Daisy Floro, MD  REFERRING PROVIDER: [name], MD    END OF SESSION:   PT End of Session - 02/11/22 0925     Visit Number 7    Number of Visits 20    Date for PT Re-Evaluation 03/29/22    Authorization Type [employer] Managed    Authorization - Visit Number 5    Authorization - Number of Visits 30    PT Start Time 0847    PT Stop Time 0938    PT Time Calculation (min) 51 min    Activity Tolerance Patient tolerated treatment well;Patient limited by pain    Behavior During Therapy [facility] for tasks assessed/performed                 Past Medical History:  Diagnosis Date   Asthma    Headache    Hypertension    Past Surgical History:  Procedure Laterality Date   COLONOSCOPY     MOUTH SURGERY     ORIF ANKLE FRACTURE Right 12/28/2021   Procedure: OPEN REDUCTION INTERNAL FIXATION (ORIF) RIGHT ANKLE FRACTURE;  Surgeon: [name], MD;  Location: [facility];  Service: Orthopedics;  Laterality: Right;   Patient Active Problem List   Diagnosis Date Noted   Closed fracture of right ankle 12/28/2021    REFERRING DIAG:  Z98.890,Z87.81 (ICD-10-CM) - S/P ORIF (open reduction internal fixation) fracture   THERAPY DIAG:  Acute right ankle pain  Stiffness of right ankle, not elsewhere classified  Difficulty in walking, not elsewhere classified  Localized edema  Rationale for Evaluation and Treatment Rehabilitation  PERTINENT HISTORY: Asthma, HA, HTN   PRECAUTIONS: none  SUBJECTIVE:                                                                                                                                                                                      SUBJECTIVE STATEMENT:   Pt stating no pain upon arrival. Pt concerned with the ASO irritation of her Rt lateral malleoli.   PAIN:  Are you  having pain? Yes, today 0/10, Pt stating pain yesterday was 3/10.   OBJECTIVE: (objective measures completed at initial evaluation unless otherwise dated)  DIAGNOSTIC FINDINGS:  01/10/22 Narrative:  Three-view radiographs of the right ankle shows stable internal fixation of the Weber B fibular fracture.  The mortise is congruent.    PATIENT SURVEYS:  01/14/22: FOTO intake:   36%   COGNITION: Overall cognitive status: [facility]  SENSATION: 01/14/22: WFL   EDEMA:  01/14/22:  Circumferential: Rt: 29 centimeters around malleoli Left: 23 centimeters        POSTURE: No Significant postural limitations   PALPATION: TTP; lateral ankle around lateral malleleus   LOWER EXTREMITY ROM:    ROM Right eval Left eval Rt 01/29/22 Right 02/05/22 Rt 02/11/22  Hip flexion         Hip extension         Hip abduction         Hip adduction         Hip internal rotation         Hip external rotation         Knee flexion         Knee extension         Ankle dorsiflexion A: 0  P: 4 A: 15 P: 20 A: 4 Seated Knee ext P: 20* A: 5* Knee flex P: 22* A: 8* Seated A: 5 Knee flexed  Ankle plantarflexion A: 12 P: 15 A: 25 A: 16 Seated P: 26* A: 18* Seated A: 18  Ankle inversion A: 10 P: 12 A: 30 A: 14 Seated With forefoot add P: 32* A: 22* Prone Hindfoot P:12* Seated A: 24  Ankle eversion A: 14 P: 18 A: 34 A: 16 Seated forefoot abd P: 22* A: 20* Prone hindfoot 6* Seated A: 24   (Blank rows = not tested)   LOWER EXTREMITY MMT:   MMT Right eval Left eval  Hip flexion 5/5 5/5  Hip extension      Hip abduction 5/5 5/5  Hip adduction 5/5 5/5  Hip internal rotation      Hip external rotation      Knee flexion 4/5 5/5  Knee extension 4/5 5/5  Ankle dorsiflexion 2/5 5/5  Ankle plantarflexion 2/5 5/5  Ankle inversion 2/5 5/5  Ankle eversion 2/5 5/5   (Blank rows = not tested)     GAIT: Distance walked: 3 feet  Assistive device utilized: CAM boot on  Rt Level of assistance: Modified independence Comments: antalgic gait pattern with UE support on mat table     TODAY'S TREATMENT                                                                          DATE: 02/11/22: TherEx:  NuStep    level 4, LE only BAPS board: Level 2 x 1 minute each direction 4 way ankle exercises: Level 3 band x 20 each  Calf raises/Toe Raises: x 15 c UE support Self care:  We discussed using bandage to help cushion her Rt lateral malleolus when wearing her brace due to the brace rubbing her skin when wearing.   Pt was fitted using  for cushioning over her ASO using "donut" cut out around Rt lateral malleoli.  Neuromuscular Re-ed: SLS on Airex mat, with finger tap and toe touch from left foot for intermittent support SLS on level surface x 5 best time was 6 seconds with no UE support Modalities:  Vasopneumatic: 34 deg, medium compression, x 10 minutes    02/07/2022: TherEx:  NuStep seat 5 level 5 with BLEs only for 8 min working on ankle DF closed chain  range & pressure Standing on AirEx mat HHA DF/PF 15 reps light UE support Step up & down using RLE 4" step BUE support 15 reps. PT demo & verbal cues on technique.  BAPS board: standing BUE support  Level 1  x 10 reps 2 sets CW/CCW circles ankle exercises with Level 3 green band x 10 each PF, PF w/inv, PF w/ever, DF, DF w/inv, DF w/ever   Therapeutic Activities: PT reviewed instructions how to donne / doffe ASO.  PT recommended wearing ASO & shoes for all gait & standing activities.  If pain or edema increase then resume CAM boot or if going somewhere that would siginficantly increase her distance.  Pt amb 100' & neg ramp/curb with cane with minor verbal cues. She appears safe to use cane for gait.  Discussed driving with braking esp quick would be issue initially. So may want to consider 2 foot driving.   Neuromuscular Re-ed: On AirEx mat static equal WBing 30 sec & tandem stance RLE in front / in back 30  sec.  Tapping cone for RLE stance 15 reps. Rocker board ant/level/post & right/level/left with intermittent UE touch 1 min ea motion.  Modalities:  Vasopneumatic: 34 deg, medium compression, x 10 minutes  02/05/2022: TherEx:  NuStep seat 5 level 5 with BLEs / BUEs for 8 min working on ankle DF closed chain range & pressure BAPS board: seated on 26" mat table  Level 2  x 10 reps 2 sets  each direction DF/PF, inv/ever/ CW/CCW circles ankle exercises with Level 2 red band x 10 each PF, PF w/inv, PF w/ever, DF, DF w/inv, DF w/ever Standing on Airex mat feet apart with no UE support, static 30 sec eyes closed and head motions eyes closed. Working on facilitating ankle strategy for stabilization.  Standing on AirEx mat HHA DF/PF 10 reps  Gait Training:  Pt amb with 2 crutches with step through pattern. 50' & 100'. Stairs step-to pattern modified to protect right ankle PT verbal cues. Pt neg 6 steps with rail & crutch with supervision.  Manual: PROM Rt ankle  Modalities:  Vasopneumatic: 34 deg, medium compression, x 10 minutes  01/31/2022: TherEx:  NuStep seat 5 level 5 with BLEs / BUEs for 6 min working on ankle DF closed chain range & pressure BAPS board: seated on 26" mat table  Level 2  x 10 reps 2 sets  each direction DF/PF, inv/ever/ CW/CCW circles ankle exercises with Level 2 red band x 10 each PF, PF w/inv, PF w/ever, DF, DF w/inv, DF w/ever Standing on Airex mat feet apart with no UE support, static 30 sec, eyes closed 10 sec X 3 and head motions eyes open. Working on facilitating ankle strategy for stabilization.   Gait Training:  PT instructed in step thru pattern & proper ankle motion with 2 crutches with shoe. Pt amb 50' with cues.  PT adjusted pt's crutch for her ht & arm length.  PT recommended using 2 crutches if walking without boot in house and 1 crutch with boot outside house until she sees Dr. Sharol Given on 1/11. He may want her in an ASO as transition to protect ankle.       PATIENT EDUCATION:  Education details: HEP, POC Person educated: Patient Education method: Consulting civil engineer, Demonstration, Verbal cues, and Handouts Education comprehension: verbalized understanding, returned demonstration, and verbal cues required   HOME EXERCISE PROGRAM: Access Code: [email or web address] URL: [email or web address] Date: 01/16/2022 Prepared by: Kearney Hard  Exercises - Seated Ankle Alphabet  -  3 x daily - 7 x weekly - Towel Scrunches  - 2 x daily - 7 x weekly - 5 reps - Ankle Inversion Eversion Towel Slide  - 3 x daily - 7 x weekly - 10 reps - 2 seconds hold - Long Sitting Calf Stretch with Strap  - 3 x daily - 7 x weekly - 3 reps - 30 seconds hold - Ankle Dorsiflexion with Resistance  - 2 x daily - 7 x weekly - 2 sets - 10 reps - Ankle Eversion with Resistance  - 2 x daily - 7 x weekly - 2 sets - 10 reps - Ankle Inversion with Resistance  - 2 x daily - 7 x weekly - 2 sets - 10 reps - Ankle and Toe Plantarflexion with Resistance  - 2 x daily - 7 x weekly - 2 sets - 10 reps   ASSESSMENT:   CLINICAL IMPRESSION:  PT tolerating more standing activities. Pt did arrive with irritation reported from her ASO. Pt stating it was rubbing her Rt malleoli. A donut cushion was made an placed insider her ASO brace. Pt reporting relief when brace was placed on her foot after cushion was applied. Consider updating pt's HEP at next visit.    OBJECTIVE IMPAIRMENTS: Abnormal gait, decreased activity tolerance, decreased balance, decreased mobility, difficulty walking, decreased ROM, decreased strength, increased edema, and pain.    ACTIVITY LIMITATIONS: bending, sitting, standing, squatting, sleeping, transfers, and dressing   PARTICIPATION LIMITATIONS: cleaning, driving, shopping, community activity, and occupation   PERSONAL FACTORS: 1-2 comorbidities: see pertinent history above  are also affecting patient's functional outcome.    REHAB POTENTIAL: Excellent   CLINICAL  DECISION MAKING: Stable/uncomplicated   EVALUATION COMPLEXITY: Low     GOALS: Goals reviewed with patient? Yes   SHORT TERM GOALS: (target date for Short term goals are 3 weeks 02/08/22)    1.  Patient will demonstrate independent use of home exercise program to maintain progress from in clinic treatments.   Goal status: MET 01/29/22   LONG TERM GOALS: (target dates for all long term goals are 10 weeks  03/29/22 )   1. Patient will demonstrate/report pain at worst less than or equal to 2/10 to facilitate minimal limitation in daily activity secondary to pain symptoms.   Goal status: On-going 02/11/22   2. Patient will demonstrate independent use of home exercise program to facilitate ability to maintain/progress functional gains from skilled physical therapy services.   Goal status: New   3. Patient will demonstrate FOTO outcome > or = 66 % to indicate reduced disability due to condition.   Goal status: New   4.  Patient will demonstrate >/= 4 LE MMT throughout to faciltiate usual transfers, stairs, squatting at [facility] for daily life.    Goal status: New   5.  Patient will be able to amb community distance with no device with step through gait pattern without antalgic gait pattern.  Goal status: New   6.  Pt will be able to navigate up and down 1 flight of stairs with single hand rail with step over step pattern.  Goal status: On-going: 02/11/22   7.  Pt will improve Rt ankle DF to >/= 15 degrees actively for improved gait and functional mobility.  Goal Status: New     PLAN:   PT FREQUENCY: 1-2x/week   PT DURATION: 10 weeks   PLANNED INTERVENTIONS: Therapeutic exercises, Therapeutic activity, Neuro Muscular re-education, Balance training, Gait training, Patient/Family education, Joint mobilization, Stair training,  DME instructions, Dry Needling, Electrical stimulation, Traction, Cryotherapy, vasopneumatic deviceMoist heat, Taping, Ultrasound, Ionotophoresis 4mg /ml  Dexamethasone, and Manual therapy.  All included unless contraindicated   PLAN FOR NEXT SESSION: standing & gait in clinic without ASO,  continue to progress mobility using ASO outside of PT for now to protect reinjury of ankle, standing exercises & balance.  Vaso to end.       , PT, MPT 02/11/2022, 9:35 AM

## 2022-02-13 ENCOUNTER — Ambulatory Visit: Payer: Managed Care, Other (non HMO) | Admitting: Physical Therapy

## 2022-02-13 ENCOUNTER — Encounter: Payer: Self-pay | Admitting: Physical Therapy

## 2022-02-13 DIAGNOSIS — R600 Localized edema: Secondary | ICD-10-CM

## 2022-02-13 DIAGNOSIS — M25671 Stiffness of right ankle, not elsewhere classified: Secondary | ICD-10-CM

## 2022-02-13 DIAGNOSIS — M25571 Pain in right ankle and joints of right foot: Secondary | ICD-10-CM

## 2022-02-13 DIAGNOSIS — R262 Difficulty in walking, not elsewhere classified: Secondary | ICD-10-CM

## 2022-02-13 NOTE — Therapy (Signed)
OUTPATIENT PHYSICAL THERAPY TREATMENT NOTE   Patient Name: Elizabeth Perry MRN: [id number] DOB:1970/11/23, 52 y.o., female Today's Date: 02/13/2022  PCP: Daisy Floro, MD  REFERRING PROVIDER: [name], MD    END OF SESSION:   PT End of Session - 02/13/22 0913     Visit Number 8    Number of Visits 20    Date for PT Re-Evaluation 03/29/22    Authorization Type [employer] Managed    Authorization - Visit Number 8    Authorization - Number of Visits 30    Progress Note Due on Visit 15    PT Start Time [phone]    PT Stop Time 0930    PT Time Calculation (min) 44 min    Activity Tolerance Patient tolerated treatment well;Patient limited by pain    Behavior During Therapy [facility] for tasks assessed/performed                  Past Medical History:  Diagnosis Date   Asthma    Headache    Hypertension    Past Surgical History:  Procedure Laterality Date   COLONOSCOPY     MOUTH SURGERY     ORIF ANKLE FRACTURE Right 12/28/2021   Procedure: OPEN REDUCTION INTERNAL FIXATION (ORIF) RIGHT ANKLE FRACTURE;  Surgeon: [name], MD;  Location: [facility] OR;  Service: Orthopedics;  Laterality: Right;   Patient Active Problem List   Diagnosis Date Noted   Closed fracture of right ankle 12/28/2021    REFERRING DIAG:  Z98.890,Z87.81 (ICD-10-CM) - S/P ORIF (open reduction internal fixation) fracture   THERAPY DIAG:  Acute right ankle pain  Stiffness of right ankle, not elsewhere classified  Difficulty in walking, not elsewhere classified  Localized edema  Rationale for Evaluation and Treatment Rehabilitation  PERTINENT HISTORY: Asthma, HA, HTN   PRECAUTIONS: none  SUBJECTIVE:                                                                                                                                                                                      SUBJECTIVE STATEMENT:   Pt stating no pain upon arrival. Pt reporting the padding placed on her ASO has helped.    PAIN:  Are you having pain? Yes, today 0/10, Pt stating pain yesterday was 3/10.   OBJECTIVE: (objective measures completed at initial evaluation unless otherwise dated)  DIAGNOSTIC FINDINGS:  01/10/22 Narrative:  Three-view radiographs of the right ankle shows stable internal fixation of the Weber B fibular fracture.  The mortise is congruent.    PATIENT SURVEYS:  01/14/22: FOTO intake:   36% 02/13/22: FOTO 63%   COGNITION: Overall cognitive status: [facility]  SENSATION: 01/14/22: WFL   EDEMA:  01/14/22:  Circumferential: Rt: 29 centimeters around malleoli Left: 23 centimeters        POSTURE: No Significant postural limitations   PALPATION: TTP; lateral ankle around lateral malleleus   LOWER EXTREMITY ROM:    ROM Right eval Left eval Rt 01/29/22 Right 02/05/22 Rt 02/11/22   Hip flexion          Hip extension          Hip abduction          Hip adduction          Hip internal rotation          Hip external rotation          Knee flexion          Knee extension          Ankle dorsiflexion A: 0  P: 4 A: 15 P: 20 A: 4 Seated Knee ext P: 20* A: 5* Knee flex P: 22* A: 8* Seated A: 5 Knee flexed   Ankle plantarflexion A: 12 P: 15 A: 25 A: 16 Seated P: 26* A: 18* Seated A: 18   Ankle inversion A: 10 P: 12 A: 30 A: 14 Seated With forefoot add P: 32* A: 22* Prone Hindfoot P:12* Seated A: 24   Ankle eversion A: 14 P: 18 A: 34 A: 16 Seated forefoot abd P: 22* A: 20* Prone hindfoot 6* Seated A: 24    (Blank rows = not tested)   LOWER EXTREMITY MMT:   MMT Right eval Left eval  Hip flexion 5/5 5/5  Hip extension      Hip abduction 5/5 5/5  Hip adduction 5/5 5/5  Hip internal rotation      Hip external rotation      Knee flexion 4/5 5/5  Knee extension 4/5 5/5  Ankle dorsiflexion 2/5 5/5  Ankle plantarflexion 2/5 5/5  Ankle inversion 2/5 5/5  Ankle eversion 2/5 5/5   (Blank rows = not tested)     GAIT: Distance walked: 3  feet  Assistive device utilized: CAM boot on Rt Level of assistance: Modified independence Comments: antalgic gait pattern with UE support on mat table     TODAY'S TREATMENT                                                                          DATE: 02/13/22: TherEx:  Recumbent bike: Level 3 x 6 minutes Leg Press: 50 # bil LE's 2x 10 Leg Press: Rt LE only: 25# x 15 Leg Press: calf raises bilateral: 25# Leg Press: Rt calf rasises 6# x 10 Lunge on 4 inch step holding flexion x 5 seconds x 15 Neuromuscular Re-ed: SLS on Airex mat, with finger tap and toe touch from left foot for intermittent support Rocker board: 1 minute each direction ( df/pf and inver/eversion) Modalities:  Vasopneumatic: 34 deg, medium compression, x 10 minutes    02/11/22: TherEx:  NuStep    level 4, LE only BAPS board: Level 2 x 1 minute each direction 4 way ankle exercises: Level 3 band x 20 each  Calf raises/Toe Raises: x 15 c UE support Self care:  We discussed using bandage to  help cushion her Rt lateral malleolus when wearing her brace due to the brace rubbing her skin when wearing.   Pt was fitted using  for cushioning over her ASO using "donut" cut out around Rt lateral malleoli.  Neuromuscular Re-ed: SLS on Airex mat, with finger tap and toe touch from left foot for intermittent support SLS on level surface x 5 best time was 6 seconds with no UE support Modalities:  Vasopneumatic: 34 deg, medium compression, x 10 minutes    02/07/2022: TherEx:  NuStep seat 5 level 5 with BLEs only for 8 min working on ankle DF closed chain range & pressure Standing on AirEx mat HHA DF/PF 15 reps light UE support Step up & down using RLE 4" step BUE support 15 reps. PT demo & verbal cues on technique.  BAPS board: standing BUE support  Level 1  x 10 reps 2 sets CW/CCW circles ankle exercises with Level 3 green band x 10 each PF, PF w/inv, PF w/ever, DF, DF w/inv, DF w/ever   Therapeutic Activities: PT  reviewed instructions how to donne / doffe ASO.  PT recommended wearing ASO & shoes for all gait & standing activities.  If pain or edema increase then resume CAM boot or if going somewhere that would siginficantly increase her distance.  Pt amb 100' & neg ramp/curb with cane with minor verbal cues. She appears safe to use cane for gait.  Discussed driving with braking esp quick would be issue initially. So may want to consider 2 foot driving.   Neuromuscular Re-ed: On AirEx mat static equal WBing 30 sec & tandem stance RLE in front / in back 30 sec.  Tapping cone for RLE stance 15 reps. Rocker board ant/level/post & right/level/left with intermittent UE touch 1 min ea motion.  Modalities:  Vasopneumatic: 34 deg, medium compression, x 10 minutes     PATIENT EDUCATION:  Education details: HEP, POC Person educated: Patient Education method: Consulting civil engineer, Demonstration, Verbal cues, and Handouts Education comprehension: verbalized understanding, returned demonstration, and verbal cues required   HOME EXERCISE PROGRAM: Access Code: [email or web address] URL: [email or web address] Date: 01/16/2022 Prepared by: Kearney Hard  Exercises - Seated Ankle Alphabet  - 3 x daily - 7 x weekly - Towel Scrunches  - 2 x daily - 7 x weekly - 5 reps - Ankle Inversion Eversion Towel Slide  - 3 x daily - 7 x weekly - 10 reps - 2 seconds hold - Long Sitting Calf Stretch with Strap  - 3 x daily - 7 x weekly - 3 reps - 30 seconds hold - Ankle Dorsiflexion with Resistance  - 2 x daily - 7 x weekly - 2 sets - 10 reps - Ankle Eversion with Resistance  - 2 x daily - 7 x weekly - 2 sets - 10 reps - Ankle Inversion with Resistance  - 2 x daily - 7 x weekly - 2 sets - 10 reps - Ankle and Toe Plantarflexion with Resistance  - 2 x daily - 7 x weekly - 2 sets - 10 reps   ASSESSMENT:   CLINICAL IMPRESSION:  Pt is making great progress with PT. PT's FOTO has increased to 63% (goal 66%). Pt reported the cushion  we placed on her ASO brace has helped decreased her pain. Continue skilled PT to maximize pt's function.    OBJECTIVE IMPAIRMENTS: Abnormal gait, decreased activity tolerance, decreased balance, decreased mobility, difficulty walking, decreased ROM, decreased strength, increased edema, and pain.    ACTIVITY LIMITATIONS: bending, sitting,  standing, squatting, sleeping, transfers, and dressing   PARTICIPATION LIMITATIONS: cleaning, driving, shopping, community activity, and occupation   PERSONAL FACTORS: 1-2 comorbidities: see pertinent history above  are also affecting patient's functional outcome.    REHAB POTENTIAL: Excellent   CLINICAL DECISION MAKING: Stable/uncomplicated   EVALUATION COMPLEXITY: Low     GOALS: Goals reviewed with patient? Yes   SHORT TERM GOALS: (target date for Short term goals are 3 weeks 02/08/22)    1.  Patient will demonstrate independent use of home exercise program to maintain progress from in clinic treatments.   Goal status: MET 01/29/22   LONG TERM GOALS: (target dates for all long term goals are 10 weeks  03/29/22 )   1. Patient will demonstrate/report pain at worst less than or equal to 2/10 to facilitate minimal limitation in daily activity secondary to pain symptoms.   Goal status: On-going 02/11/22   2. Patient will demonstrate independent use of home exercise program to facilitate ability to maintain/progress functional gains from skilled physical therapy services.   Goal status: New   3. Patient will demonstrate FOTO outcome > or = 66 % to indicate reduced disability due to condition.   Goal status: 63% on 02/13/22   4.  Patient will demonstrate >/= 4 LE MMT throughout to faciltiate usual transfers, stairs, squatting at [facility] for daily life.    Goal status: New   5.  Patient will be able to amb community distance with no device with step through gait pattern without antalgic gait pattern.  Goal status: New   6.  Pt will be able to navigate  up and down 1 flight of stairs with single hand rail with step over step pattern.  Goal status: On-going: 02/11/22   7.  Pt will improve Rt ankle DF to >/= 15 degrees actively for improved gait and functional mobility.  Goal Status: New     PLAN:   PT FREQUENCY: 1-2x/week   PT DURATION: 10 weeks   PLANNED INTERVENTIONS: Therapeutic exercises, Therapeutic activity, Neuro Muscular re-education, Balance training, Gait training, Patient/Family education, Joint mobilization, Stair training, DME instructions, Dry Needling, Electrical stimulation, Traction, Cryotherapy, vasopneumatic deviceMoist heat, Taping, Ultrasound, Ionotophoresis 4mg /ml Dexamethasone, and Manual therapy.  All included unless contraindicated   PLAN FOR NEXT SESSION:  Update HEP standing & gait in clinic without ASO,  continue to progress mobility using ASO outside of PT for now to protect reinjury of ankle, standing exercises & balance.  Vaso to end.       Oretha Caprice, PT, MPT 02/13/2022, 9:22 AM

## 2022-02-18 ENCOUNTER — Encounter: Payer: Managed Care, Other (non HMO) | Admitting: Physical Therapy

## 2022-02-20 ENCOUNTER — Encounter: Payer: Self-pay | Admitting: Physical Therapy

## 2022-02-20 ENCOUNTER — Ambulatory Visit: Payer: Managed Care, Other (non HMO) | Admitting: Physical Therapy

## 2022-02-20 DIAGNOSIS — R600 Localized edema: Secondary | ICD-10-CM | POA: Diagnosis not present

## 2022-02-20 DIAGNOSIS — M25671 Stiffness of right ankle, not elsewhere classified: Secondary | ICD-10-CM | POA: Diagnosis not present

## 2022-02-20 DIAGNOSIS — M25571 Pain in right ankle and joints of right foot: Secondary | ICD-10-CM | POA: Diagnosis not present

## 2022-02-20 DIAGNOSIS — R262 Difficulty in walking, not elsewhere classified: Secondary | ICD-10-CM

## 2022-02-20 NOTE — Therapy (Signed)
OUTPATIENT PHYSICAL THERAPY TREATMENT NOTE   Patient Name: Elizabeth Perry MRN: [id number] DOB:Jun 20, 1970, 52 y.o., female Today's Date: 02/20/2022  PCP: Daisy Floro, MD  REFERRING PROVIDER: [name], MD    END OF SESSION:   PT End of Session - 02/20/22 1434     Visit Number 9    Number of Visits 20    Date for PT Re-Evaluation 03/29/22    Authorization Type [employer] Managed    Authorization - Visit Number 7   count restarted with 2024   Authorization - Number of Visits 30    Progress Note Due on Visit 15    PT Start Time 1431    PT Stop Time 1525    PT Time Calculation (min) 54 min    Activity Tolerance Patient tolerated treatment well    Behavior During Therapy [facility] for tasks assessed/performed                  Past Medical History:  Diagnosis Date   Asthma    Headache    Hypertension    Past Surgical History:  Procedure Laterality Date   COLONOSCOPY     MOUTH SURGERY     ORIF ANKLE FRACTURE Right 12/28/2021   Procedure: OPEN REDUCTION INTERNAL FIXATION (ORIF) RIGHT ANKLE FRACTURE;  Surgeon: [name], MD;  Location: [facility] OR;  Service: Orthopedics;  Laterality: Right;   Patient Active Problem List   Diagnosis Date Noted   Closed fracture of right ankle 12/28/2021    REFERRING DIAG:  Z98.890,Z87.81 (ICD-10-CM) - S/P ORIF (open reduction internal fixation) fracture   THERAPY DIAG:  Acute right ankle pain  Stiffness of right ankle, not elsewhere classified  Difficulty in walking, not elsewhere classified  Localized edema  Rationale for Evaluation and Treatment Rehabilitation  PERTINENT HISTORY: Asthma, HA, HTN   PRECAUTIONS: none  SUBJECTIVE:                                                                                                                                                                                      SUBJECTIVE STATEMENT:   Using ASO & cane for community. In home no device & occasional ASO if doing lot of  cleaning.    PAIN:  Are you having pain? Yes, today 2/10, Pt stating pain yesterday was 3/10.   OBJECTIVE: (objective measures completed at initial evaluation unless otherwise dated)  DIAGNOSTIC FINDINGS:  01/10/22 Narrative:  Three-view radiographs of the right ankle shows stable internal fixation of the Weber B fibular fracture.  The mortise is congruent.    PATIENT SURVEYS:  01/14/22: FOTO intake:   36% 02/13/22: FOTO 63%  COGNITION: Overall cognitive status: WFL                  SENSATION: 01/14/22: WFL   EDEMA:  01/14/22:  Circumferential: Rt: 29 centimeters around malleoli Left: 23 centimeters        POSTURE: No Significant postural limitations   PALPATION: TTP; lateral ankle around lateral malleleus   LOWER EXTREMITY ROM:    ROM Right eval Left eval Rt 01/29/22 Right 02/05/22 Rt 02/11/22 Right 02/20/22  Hip flexion          Hip extension          Hip abduction          Hip adduction          Hip internal rotation          Hip external rotation          Knee flexion          Knee extension          Ankle dorsiflexion A: 0  P: 4 A: 15 P: 20 A: 4 Seated Knee ext P: 20* A: 5* Knee flex P: 22* A: 8* Seated A: 5 Knee flexed Seated Knee ext P: 22* A: 12*   Ankle plantarflexion A: 12 P: 15 A: 25 A: 16 Seated P: 26* A: 18* Seated A: 18 Seated A: 22*  Ankle inversion A: 10 P: 12 A: 30 A: 14 Seated With forefoot add P: 32* A: 22* Prone Hindfoot P:12* Seated A: 24   Ankle eversion A: 14 P: 18 A: 34 A: 16 Seated forefoot abd P: 22* A: 20* Prone hindfoot 6* Seated A: 24    (Blank rows = not tested)   LOWER EXTREMITY MMT:   MMT Right eval Left eval  Hip flexion 5/5 5/5  Hip extension      Hip abduction 5/5 5/5  Hip adduction 5/5 5/5  Hip internal rotation      Hip external rotation      Knee flexion 4/5 5/5  Knee extension 4/5 5/5  Ankle dorsiflexion 2/5 5/5  Ankle plantarflexion 2/5 5/5  Ankle inversion 2/5 5/5  Ankle  eversion 2/5 5/5   (Blank rows = not tested)     GAIT: Distance walked: 3 feet  Assistive device utilized: CAM boot on Rt Level of assistance: Modified independence Comments: antalgic gait pattern with UE support on mat table     TODAY'S TREATMENT                                                                          DATE: 02/20/2022: TherEx: no ASO in clinic Leg Press: 62# bil LE's 2 x 15 Leg Press: Rt LE only: 31# x 15 Leg Press: calf raises bilateral: 25# X 15 Leg Press: Rt calf rasises 50# x 15 Gastroc stretch on step heel depression 30 sec 2 reps BLE heel raises sliding pillow case upward on mirror 15 reps 2 sets. BLE heel raise with arc motion for pronation / supination motion of ankle 15 reps 2 sets Heel raises BLE concentric & RLE eccentric with BUE support 10 reps Green theraband RLE long sit PF 15 reps, PF with inversion 15 reps, PF with eversion 15 reps  PT updated HEP to include standing exercises.  See below. Pt verbalized & return demo understanding.   Therapeutic Activities: Stairs 2 rails 1 flight 11 steps alternating pattern with PT demo & verbal cues on technique ankle motion with supervision  Neuromuscular Re-ed: Tandem stance RLE in front & in back 30 sec ea.  RLE SLS on Airex mat without UE support toe touch from left foot to 3 cones progressively crossing over RLE 15 reps.   Modalities:  Vasopneumatic: right ankle / foot 34 deg, medium compression, x 10 minutes   02/13/22: TherEx:  Recumbent bike: Level 3 x 6 minutes Leg Press: 50 # bil LE's 2x 10 Leg Press: Rt LE only: 25# x 15 Leg Press: calf raises bilateral: 25# Leg Press: Rt calf rasises 6# x 10 Lunge on 4 inch step holding flexion x 5 seconds x 15 Neuromuscular Re-ed: SLS on Airex mat, with finger tap and toe touch from left foot for intermittent support Rocker board: 1 minute each direction ( df/pf and inver/eversion) Modalities:  Vasopneumatic: 34 deg, medium compression, x 10  minutes    02/11/22: TherEx:  NuStep    level 4, LE only BAPS board: Level 2 x 1 minute each direction 4 way ankle exercises: Level 3 band x 20 each  Calf raises/Toe Raises: x 15 c UE support Self care:  We discussed using bandage to help cushion her Rt lateral malleolus when wearing her brace due to the brace rubbing her skin when wearing.   Pt was fitted using  for cushioning over her ASO using "donut" cut out around Rt lateral malleoli.  Neuromuscular Re-ed: SLS on Airex mat, with finger tap and toe touch from left foot for intermittent support SLS on level surface x 5 best time was 6 seconds with no UE support Modalities:  Vasopneumatic: 34 deg, medium compression, x 10 minutes     PATIENT EDUCATION:  Education details: HEP, POC Person educated: Patient Education method: Consulting civil engineer, Demonstration, Verbal cues, and Handouts Education comprehension: verbalized understanding, returned demonstration, and verbal cues required   HOME EXERCISE PROGRAM: Access Code: [email or web address] URL: [email or web address] Date: 02/20/2022 Prepared by: Jamey Reas  Exercises - Seated Ankle Alphabet  - 3 x daily - 7 x weekly - Towel Scrunches  - 2 x daily - 7 x weekly - 5 reps - Ankle Inversion Eversion Towel Slide  - 3 x daily - 7 x weekly - 10 reps - 2 seconds hold - Long Sitting Calf Stretch with Strap  - 3 x daily - 7 x weekly - 3 reps - 30 seconds hold - Ankle Dorsiflexion with Resistance  - 2 x daily - 7 x weekly - 2 sets - 10 reps - Ankle Eversion with Resistance  - 2 x daily - 7 x weekly - 2 sets - 10 reps - Ankle Inversion with Resistance  - 2 x daily - 7 x weekly - 2 sets - 10 reps - Ankle and Toe Plantarflexion with Resistance  - 2 x daily - 7 x weekly - 2 sets - 10 reps - Long Sitting Ankle Plantarflexion with inversion & eversion  - 1 x daily - 7 x weekly - 1 sets - 10 reps - 5 seconds hold - Gastroc Stretch on Step  - 2 x daily - 7 x weekly - 1 sets - 3 reps - 20-30  seconds hold - Standing Heel Raises  - 1 x daily - 7 x weekly - 1-2 sets - 10-15 reps - 5 seconds  hold - Toe raises in Corner  - 1 x daily - 7 x weekly - 2-3 sets - 10 reps - 5 seconds hold - Standing Eccentric Heel Raise  - 1 x daily - 7 x weekly - 1-2 sets - 10-15 reps - 5 seconds hold - stair climbing using alternating pattern with 2 rails  - 1-2 x daily - 7 x weekly - 1 sets - 1 reps   ASSESSMENT:   CLINICAL IMPRESSION:  PT updated HEP with current level which she appears to understand.  Pt improved ankle range actively.  Pt improved ankle motion on stairs with instruction.    OBJECTIVE IMPAIRMENTS: Abnormal gait, decreased activity tolerance, decreased balance, decreased mobility, difficulty walking, decreased ROM, decreased strength, increased edema, and pain.    ACTIVITY LIMITATIONS: bending, sitting, standing, squatting, sleeping, transfers, and dressing   PARTICIPATION LIMITATIONS: cleaning, driving, shopping, community activity, and occupation   PERSONAL FACTORS: 1-2 comorbidities: see pertinent history above  are also affecting patient's functional outcome.    REHAB POTENTIAL: Excellent   CLINICAL DECISION MAKING: Stable/uncomplicated   EVALUATION COMPLEXITY: Low     GOALS: Goals reviewed with patient? Yes   UPDATED SHORT TERM GOALS: (target date for Short term goals 03/01/22)    1.  Patient will demonstrate understanding of updated home exercise program to maintain progress from in clinic treatments.   Goal status: ongoing 02/20/2022   2.  Patient reports ambulate in community with cane or less & ASO without any issues.   Goal status: ongoing 02/20/2022   LONG TERM GOALS: (target dates for all long term goals are 10 weeks  03/29/22 )   1. Patient will demonstrate/report pain at worst less than or equal to 2/10 to facilitate minimal limitation in daily activity secondary to pain symptoms.   Goal status: On-going 02/11/22   2. Patient will demonstrate independent use  of home exercise program to facilitate ability to maintain/progress functional gains from skilled physical therapy services.   Goal status: New   3. Patient will demonstrate FOTO outcome > or = 66 % to indicate reduced disability due to condition.   Goal status: 63% on 02/13/22   4.  Patient will demonstrate >/= 4 LE MMT throughout to faciltiate usual transfers, stairs, squatting at [facility] for daily life.    Goal status: New   5.  Patient will be able to amb community distance with no device with step through gait pattern without antalgic gait pattern.  Goal status: New   6.  Pt will be able to navigate up and down 1 flight of stairs with single hand rail with step over step pattern.  Goal status: On-going: 02/11/22   7.  Pt will improve Rt ankle DF to >/= 15 degrees actively for improved gait and functional mobility.  Goal Status: New     PLAN:   PT FREQUENCY: 1-2x/week   PT DURATION: 10 weeks   PLANNED INTERVENTIONS: Therapeutic exercises, Therapeutic activity, Neuro Muscular re-education, Balance training, Gait training, Patient/Family education, Joint mobilization, Stair training, DME instructions, Dry Needling, Electrical stimulation, Traction, Cryotherapy, vasopneumatic deviceMoist heat, Taping, Ultrasound, Ionotophoresis 4mg /ml Dexamethasone, and Manual therapy.  All included unless contraindicated   PLAN FOR NEXT SESSION:  Verbally check updated HEP, work towards updated STGs, standing & gait in clinic without ASO,  continue to progress mobility using ASO outside of PT for now to protect reinjury of ankle, standing exercises & balance.  Vaso to end.       , PT,  MPT 02/20/2022, 4:13 PM

## 2022-02-27 ENCOUNTER — Encounter: Payer: Self-pay | Admitting: Physical Therapy

## 2022-02-27 ENCOUNTER — Ambulatory Visit: Payer: Managed Care, Other (non HMO) | Admitting: Physical Therapy

## 2022-02-27 DIAGNOSIS — M25671 Stiffness of right ankle, not elsewhere classified: Secondary | ICD-10-CM | POA: Diagnosis not present

## 2022-02-27 DIAGNOSIS — R262 Difficulty in walking, not elsewhere classified: Secondary | ICD-10-CM | POA: Diagnosis not present

## 2022-02-27 DIAGNOSIS — R600 Localized edema: Secondary | ICD-10-CM | POA: Diagnosis not present

## 2022-02-27 DIAGNOSIS — M25571 Pain in right ankle and joints of right foot: Secondary | ICD-10-CM | POA: Diagnosis not present

## 2022-02-27 NOTE — Therapy (Signed)
OUTPATIENT PHYSICAL THERAPY TREATMENT NOTE   Patient Name: Elizabeth Perry MRN: [id number] DOB:01/02/1971, 52 y.o., female Today's Date: 02/27/2022  PCP: Daisy Floro, MD  REFERRING PROVIDER: [name], MD    END OF SESSION:   PT End of Session - 02/27/22 1518     Visit Number 10    Number of Visits 20    Date for PT Re-Evaluation 03/29/22    Authorization Type [employer] Managed    Authorization - Number of Visits 30    Progress Note Due on Visit 15    PT Start Time 1516    PT Stop Time 1558    PT Time Calculation (min) 42 min    Activity Tolerance Patient tolerated treatment well    Behavior During Therapy WFL for tasks assessed/performed                   Past Medical History:  Diagnosis Date   Asthma    Headache    Hypertension    Past Surgical History:  Procedure Laterality Date   COLONOSCOPY     MOUTH SURGERY     ORIF ANKLE FRACTURE Right 12/28/2021   Procedure: OPEN REDUCTION INTERNAL FIXATION (ORIF) RIGHT ANKLE FRACTURE;  Surgeon: [name], MD;  Location: [facility] OR;  Service: Orthopedics;  Laterality: Right;   Patient Active Problem List   Diagnosis Date Noted   Closed fracture of right ankle 12/28/2021    REFERRING DIAG:  Z98.890,Z87.81 (ICD-10-CM) - S/P ORIF (open reduction internal fixation) fracture   THERAPY DIAG:  Acute right ankle pain  Stiffness of right ankle, not elsewhere classified  Difficulty in walking, not elsewhere classified  Localized edema  Rationale for Evaluation and Treatment Rehabilitation  PERTINENT HISTORY: Asthma, HA, HTN   PRECAUTIONS: none  SUBJECTIVE:                                                                                                                                                                                      SUBJECTIVE STATEMENT:   She has been doing her exercises.  She has returned to work some from home & some at office.  She can not elevate at office pushing 8 hour. Her  right ankle swells.  "It may be some for brace."  She is only wearing ASO when she leaves house.     PAIN:  Are you having pain? Yes, today  2.5/10, since last PT ranging 1-2.5/5  OBJECTIVE: (objective measures completed at initial evaluation unless otherwise dated)  DIAGNOSTIC FINDINGS:  01/10/22 Narrative:  Three-view radiographs of the right ankle shows stable internal fixation of the Weber B fibular fracture.  The  mortise is congruent.    PATIENT SURVEYS:  01/14/22: FOTO intake:   36% 02/13/22: FOTO 63%   COGNITION: Overall cognitive status: WFL                  SENSATION: 01/14/22: WFL   EDEMA:  01/14/22:  Circumferential: Rt: 29 centimeters around malleoli Left: 23 centimeters        POSTURE: No Significant postural limitations   PALPATION: TTP; lateral ankle around lateral malleleus   LOWER EXTREMITY ROM:    ROM Right eval Left eval Rt 01/29/22 Right 02/05/22 Rt 02/11/22 Right 02/20/22  Hip flexion          Hip extension          Hip abduction          Hip adduction          Hip internal rotation          Hip external rotation          Knee flexion          Knee extension          Ankle dorsiflexion A: 0  P: 4 A: 15 P: 20 A: 4 Seated Knee ext P: 20* A: 5* Knee flex P: 22* A: 8* Seated A: 5 Knee flexed Seated Knee ext P: 22* A: 12*   Ankle plantarflexion A: 12 P: 15 A: 25 A: 16 Seated P: 26* A: 18* Seated A: 18 Seated A: 22*  Ankle inversion A: 10 P: 12 A: 30 A: 14 Seated With forefoot add P: 32* A: 22* Prone Hindfoot P:12* Seated A: 24   Ankle eversion A: 14 P: 18 A: 34 A: 16 Seated forefoot abd P: 22* A: 20* Prone hindfoot 6* Seated A: 24    (Blank rows = not tested)   LOWER EXTREMITY MMT:   MMT Right eval Left eval  Hip flexion 5/5 5/5  Hip extension      Hip abduction 5/5 5/5  Hip adduction 5/5 5/5  Hip internal rotation      Hip external rotation      Knee flexion 4/5 5/5  Knee extension 4/5 5/5  Ankle  dorsiflexion 2/5 5/5  Ankle plantarflexion 2/5 5/5  Ankle inversion 2/5 5/5  Ankle eversion 2/5 5/5   (Blank rows = not tested)     GAIT: 02/27/2022: Pt amb without ASO >500', neg ramp & curb with no LOB but noted decrease in ankle DF. Pt able to pick up speed slightly when asked. Pt able to zig-zag without ankle rolling. Pt neg stairs alternating pattern 11 steps 1 rep 2 rails & 2nd left rail.  Pt's ankle appears stable but noted decreased range. PT recommended if no rails use step-to pattern. Pt verbalized understanding.  PT advised okay to not use ASO on paved even surfaces.  If going on grass, gravel or sand, she should use ASO.  Take ASO to work. If notices ankle is tired/fatigued, put ASO on ankle.  Pt verbalizes understanding.   01/14/2022: Distance walked: 3 feet  Assistive device utilized: CAM boot on Rt Level of assistance: Modified independence Comments: antalgic gait pattern with UE support on mat table     TODAY'S TREATMENT  DATE: 02/27/2022: Therapeutic exercise: no ASO Recumbent bike seat 3 level 1 for 6 min. Heel raises without UE support BLEs concentric & RLE isometric/eccentric 10 reps Gastroc- Soleus stretch on step heel depression knee straight 30 sec & knee flexed 30 sec Inversion stretch standing with weight on RLE medial foot on 1" thick towel 30 sec Eversion stretch standing with weight on RLE lateral foot on 1" thick towel 30 sec Pt verbalized understanding of above stretches as HEP.  BAPs standing RLE level 1 DF/PF, inv/ever & circles CW/CCW 10 reps ea.   Therapeutic Activities: Pt amb without ASO >500', neg ramp & curb with no LOB but noted decrease in ankle DF. Pt able to pick up speed slightly when asked. Pt able to zig-zag without ankle rolling. Pt neg stairs alternating pattern 11 steps 1 rep 2 rails & 2nd left rail.  Pt's ankle appears stable but noted decreased range. PT recommended  if no rails use step-to pattern. Pt verbalized understanding.  PT advised okay to not use ASO on paved even surfaces.  If going on grass, gravel or sand, she should use ASO.  Take ASO to work. If notices ankle is tired/fatigued, put ASO on ankle.  Pt verbalizes understanding.   Neuromuscular RE-ed: Tandem forward & back on foam beam including stepping on/off beam 3 laps without UE support. Sidestepping right/left on foam beam including stepping on/off beam 3 laps without UE support. Braiding on floor 3 laps without UE support.  02/20/2022: TherEx: no ASO in clinic Leg Press: 62# bil LE's 2 x 15 Leg Press: Rt LE only: 31# x 15 Leg Press: calf raises bilateral: 25# X 15 Leg Press: Rt calf rasises 50# x 15 Gastroc stretch on step heel depression 30 sec 2 reps BLE heel raises sliding pillow case upward on mirror 15 reps 2 sets. BLE heel raise with arc motion for pronation / supination motion of ankle 15 reps 2 sets Heel raises BLE concentric & RLE eccentric with BUE support 10 reps Green theraband RLE long sit PF 15 reps, PF with inversion 15 reps, PF with eversion 15 reps PT updated HEP to include standing exercises.  See below. Pt verbalized & return demo understanding.   Therapeutic Activities: Stairs 2 rails 1 flight 11 steps alternating pattern with PT demo & verbal cues on technique ankle motion with supervision  Neuromuscular Re-ed: Tandem stance RLE in front & in back 30 sec ea.  RLE SLS on Airex mat without UE support toe touch from left foot to 3 cones progressively crossing over RLE 15 reps.   Modalities:  Vasopneumatic: right ankle / foot 34 deg, medium compression, x 10 minutes   02/13/22: TherEx:  Recumbent bike: Level 3 x 6 minutes Leg Press: 50 # bil LE's 2x 10 Leg Press: Rt LE only: 25# x 15 Leg Press: calf raises bilateral: 25# Leg Press: Rt calf rasises 6# x 10 Lunge on 4 inch step holding flexion x 5 seconds x 15 Neuromuscular Re-ed: SLS on Airex mat, with  finger tap and toe touch from left foot for intermittent support Rocker board: 1 minute each direction ( df/pf and inver/eversion) Modalities:  Vasopneumatic: 34 deg, medium compression, x 10 minutes     PATIENT EDUCATION:  Education details: HEP, POC Person educated: Patient Education method: Consulting civil engineer, Demonstration, Verbal cues, and Handouts Education comprehension: verbalized understanding, returned demonstration, and verbal cues required   HOME EXERCISE PROGRAM: Access Code: [email or web address] URL: [email or web address] Date: 02/20/2022 Prepared by: Jamey Reas  Exercises -  Seated Ankle Alphabet  - 3 x daily - 7 x weekly - Towel Scrunches  - 2 x daily - 7 x weekly - 5 reps - Ankle Inversion Eversion Towel Slide  - 3 x daily - 7 x weekly - 10 reps - 2 seconds hold - Long Sitting Calf Stretch with Strap  - 3 x daily - 7 x weekly - 3 reps - 30 seconds hold - Ankle Dorsiflexion with Resistance  - 2 x daily - 7 x weekly - 2 sets - 10 reps - Ankle Eversion with Resistance  - 2 x daily - 7 x weekly - 2 sets - 10 reps - Ankle Inversion with Resistance  - 2 x daily - 7 x weekly - 2 sets - 10 reps - Ankle and Toe Plantarflexion with Resistance  - 2 x daily - 7 x weekly - 2 sets - 10 reps - Long Sitting Ankle Plantarflexion with inversion & eversion  - 1 x daily - 7 x weekly - 1 sets - 10 reps - 5 seconds hold - Gastroc Stretch on Step  - 2 x daily - 7 x weekly - 1 sets - 3 reps - 20-30 seconds hold - Standing Heel Raises  - 1 x daily - 7 x weekly - 1-2 sets - 10-15 reps - 5 seconds hold - Toe raises in Corner  - 1 x daily - 7 x weekly - 2-3 sets - 10 reps - 5 seconds hold - Standing Eccentric Heel Raise  - 1 x daily - 7 x weekly - 1-2 sets - 10-15 reps - 5 seconds hold - stair climbing using alternating pattern with 2 rails  - 1-2 x daily - 7 x weekly - 1 sets - 1 reps   ASSESSMENT:   CLINICAL IMPRESSION:  Patient's ankle appear stable without support of ASO for gait on  noncompliant surfaces including ramp, curb & stairs with rail.  Pt appears to understand updated stretches for ankle in standing so body weight helps.    OBJECTIVE IMPAIRMENTS: Abnormal gait, decreased activity tolerance, decreased balance, decreased mobility, difficulty walking, decreased ROM, decreased strength, increased edema, and pain.    ACTIVITY LIMITATIONS: bending, sitting, standing, squatting, sleeping, transfers, and dressing   PARTICIPATION LIMITATIONS: cleaning, driving, shopping, community activity, and occupation   PERSONAL FACTORS: 1-2 comorbidities: see pertinent history above  are also affecting patient's functional outcome.    REHAB POTENTIAL: Excellent   CLINICAL DECISION MAKING: Stable/uncomplicated   EVALUATION COMPLEXITY: Low     GOALS: Goals reviewed with patient? Yes   UPDATED SHORT TERM GOALS: (target date for Short term goals 03/01/22)    1.  Patient will demonstrate understanding of updated home exercise program to maintain progress from in clinic treatments.   Goal status: ongoing 02/20/2022   2.  Patient reports ambulate in community with cane or less & ASO without any issues.   Goal status: ongoing 02/20/2022   LONG TERM GOALS: (target dates for all long term goals are 10 weeks  03/29/22 )   1. Patient will demonstrate/report pain at worst less than or equal to 2/10 to facilitate minimal limitation in daily activity secondary to pain symptoms.   Goal status: On-going 02/11/22   2. Patient will demonstrate independent use of home exercise program to facilitate ability to maintain/progress functional gains from skilled physical therapy services.   Goal status: New   3. Patient will demonstrate FOTO outcome > or = 66 % to indicate reduced disability due to condition.  Goal status: 63% on 02/13/22   4.  Patient will demonstrate >/= 4 LE MMT throughout to faciltiate usual transfers, stairs, squatting at [facility] for daily life.    Goal status: New   5.   Patient will be able to amb community distance with no device with step through gait pattern without antalgic gait pattern.  Goal status: New   6.  Pt will be able to navigate up and down 1 flight of stairs with single hand rail with step over step pattern.  Goal status: On-going: 02/11/22   7.  Pt will improve Rt ankle DF to >/= 15 degrees actively for improved gait and functional mobility.  Goal Status: New     PLAN:   PT FREQUENCY: 1-2x/week   PT DURATION: 10 weeks   PLANNED INTERVENTIONS: Therapeutic exercises, Therapeutic activity, Neuro Muscular re-education, Balance training, Gait training, Patient/Family education, Joint mobilization, Stair training, DME instructions, Dry Needling, Electrical stimulation, Traction, Cryotherapy, vasopneumatic deviceMoist heat, Taping, Ultrasound, Ionotophoresis 4mg /ml Dexamethasone, and Manual therapy.  All included unless contraindicated   PLAN FOR NEXT SESSION:  Send progress note to MD prior to 2/8 appt, 1x/wk with PT instructing in progressive activities for HEP.       Jamey Reas, PT, DPT 02/27/2022, 4:10 PM

## 2022-03-05 ENCOUNTER — Encounter: Payer: Self-pay | Admitting: Physical Therapy

## 2022-03-05 ENCOUNTER — Ambulatory Visit: Payer: Managed Care, Other (non HMO) | Admitting: Physical Therapy

## 2022-03-05 DIAGNOSIS — R600 Localized edema: Secondary | ICD-10-CM

## 2022-03-05 DIAGNOSIS — R262 Difficulty in walking, not elsewhere classified: Secondary | ICD-10-CM

## 2022-03-05 DIAGNOSIS — M25671 Stiffness of right ankle, not elsewhere classified: Secondary | ICD-10-CM

## 2022-03-05 DIAGNOSIS — M25571 Pain in right ankle and joints of right foot: Secondary | ICD-10-CM

## 2022-03-05 NOTE — Therapy (Signed)
OUTPATIENT PHYSICAL THERAPY TREATMENT NOTE   Patient Name: Elizabeth Perry MRN: [id number] DOB:[birth year], 52 y.o., female Today's Date: 03/05/2022  PCP: Lawerance Cruel, MD  REFERRING PROVIDER: Newt Minion, MD    Progress Note Reporting Period 01/14/22 to 03/05/22  See note below for Objective Data and Assessment of Progress/Goals.     END OF SESSION:   PT End of Session - 03/05/22 1149     Visit Number 11    Number of Visits 20    Date for PT Re-Evaluation 03/29/22    Authorization Type [employer] Managed    Authorization Time Period $60 co-pay, 30 visit limit    Authorization - Visit Number 8    Authorization - Number of Visits 30    Progress Note Due on Visit 21    PT Start Time 1145    PT Stop Time 1225    PT Time Calculation (min) 40 min    Activity Tolerance Patient tolerated treatment well    Behavior During Therapy WFL for tasks assessed/performed                    Past Medical History:  Diagnosis Date   Asthma    Headache    Hypertension    Past Surgical History:  Procedure Laterality Date   COLONOSCOPY     MOUTH SURGERY     ORIF ANKLE FRACTURE Right 12/28/2021   Procedure: OPEN REDUCTION INTERNAL FIXATION (ORIF) RIGHT ANKLE FRACTURE;  Surgeon: Newt Minion, MD;  Location: [place];  Service: Orthopedics;  Laterality: Right;   Patient Active Problem List   Diagnosis Date Noted   Closed fracture of right ankle 12/28/2021    REFERRING DIAG:  Z98.890,Z87.81 (ICD-10-CM) - S/P ORIF (open reduction internal fixation) fracture   THERAPY DIAG:  Acute right ankle pain  Stiffness of right ankle, not elsewhere classified  Difficulty in walking, not elsewhere classified  Localized edema  Rationale for Evaluation and Treatment Rehabilitation  PERTINENT HISTORY: Asthma, HA, HTN   PRECAUTIONS: none  SUBJECTIVE:                                                                                                                                                                                       SUBJECTIVE STATEMENT:   Pt arriving today reporting 3-4/10 pain in her lateral Rt ankle. Pt reporting periods of sharp pains which comes and goes fairly quickly.   PAIN:  Are you having pain? Yes, today 3-4/10  OBJECTIVE: (objective measures completed at initial evaluation unless otherwise dated)  DIAGNOSTIC FINDINGS:  01/10/22 Narrative:  Three-view radiographs of the right ankle shows stable internal fixation of  the Weber B fibular fracture.  The mortise is congruent.    PATIENT SURVEYS:  01/14/22: FOTO intake:   36% 02/13/22: FOTO 63% 03/05/22: FOTO 76%   COGNITION: Overall cognitive status: WFL                  SENSATION: 01/14/22: WFL   EDEMA:  01/14/22:  Circumferential: Rt: 29 centimeters around malleoli Left: 23 centimeters        POSTURE: No Significant postural limitations   PALPATION: TTP; lateral ankle around lateral malleleus   LOWER EXTREMITY ROM:    ROM Right eval Left eval Rt 01/29/22 Right 02/05/22 Rt 02/11/22 Right 02/20/22 Rt 03/05/22  Hip flexion           Hip extension           Hip abduction           Hip adduction           Hip internal rotation           Hip external rotation           Knee flexion           Knee extension           Ankle dorsiflexion A: 0  P: 4 A: 15 P: 20 A: 4 Seated Knee ext P: 20* A: 5* Knee flex P: 22* A: 8* Seated A: 5 Knee flexed Seated Knee ext P: 22* A: 12*  Seated Knee ext  A: 14 P: 20  Ankle plantarflexion A: 12 P: 15 A: 25 A: 16 Seated P: 26* A: 18* Seated A: 18 Seated A: 22* Seated: A: 22  Ankle inversion A: 10 P: 12 A: 30 A: 14 Seated With forefoot add P: 32* A: 22* Prone Hindfoot P:12* Seated A: 24  Seated A: 26 P: 30  Ankle eversion A: 14 P: 18 A: 34 A: 16 Seated forefoot abd P: 22* A: 20* Prone hindfoot 6* Seated A: 24  Seated  A: 30 P: 35   (Blank rows = not tested)   LOWER EXTREMITY MMT:   MMT Right eval  Left eval  Hip flexion 5/5 5/5  Hip extension      Hip abduction 5/5 5/5  Hip adduction 5/5 5/5  Hip internal rotation      Hip external rotation      Knee flexion 4/5 5/5  Knee extension 4/5 5/5  Ankle dorsiflexion 2/5 5/5  Ankle plantarflexion 2/5 5/5  Ankle inversion 2/5 5/5  Ankle eversion 2/5 5/5   (Blank rows = not tested)     GAIT: 02/27/2022: Pt amb without ASO >500', neg ramp & curb with no LOB but noted decrease in ankle DF. Pt able to pick up speed slightly when asked. Pt able to zig-zag without ankle rolling. Pt neg stairs alternating pattern 11 steps 1 rep 2 rails & 2nd left rail.  Pt's ankle appears stable but noted decreased range. PT recommended if no rails use step-to pattern. Pt verbalized understanding.  PT advised okay to not use ASO on paved even surfaces.  If going on grass, gravel or sand, she should use ASO.  Take ASO to work. If notices ankle is tired/fatigued, put ASO on ankle.  Pt verbalizes understanding.   01/14/2022: Distance walked: 3 feet  Assistive device utilized: CAM boot on Rt Level of assistance: Modified independence Comments: antalgic gait pattern with UE support on mat table     TODAY'S TREATMENT  DATE: 03/05/22:  TherEx:  Nustep no UE's Level 5 x 6 minutes Calf stretch: slant board x 3 holding 40 sec Leg Press: bilateral LE's 75# x 15 Leg Press: Rt LE only 50# x 15 Leg Press: bil calf raises  x 15 BOSU ball dome up: both legs x 60 sec BOSU ball dome  up SLS: Rt LE x 30 sec x 2 BOSU ball dome up tandem stance: each foot forward x 30 seconds (Pt requiring intermittent finger taps for balance) Blaze pods: SLS with 3 pods placed in semicircle in front of pt on level ground est time was 26 tap Blaze pods placed about 10 feet apart in triangle pattern (6 taps)  02/27/2022: Therapeutic exercise: no ASO Recumbent bike seat 3 level 1 for 6 min. Heel raises without UE  support BLEs concentric & RLE isometric/eccentric 10 reps Gastroc- Soleus stretch on step heel depression knee straight 30 sec & knee flexed 30 sec Inversion stretch standing with weight on RLE medial foot on 1" thick towel 30 sec Eversion stretch standing with weight on RLE lateral foot on 1" thick towel 30 sec Pt verbalized understanding of above stretches as HEP.  BAPs standing RLE level 1 DF/PF, inv/ever & circles CW/CCW 10 reps ea.   Therapeutic Activities: Pt amb without ASO >500', neg ramp & curb with no LOB but noted decrease in ankle DF. Pt able to pick up speed slightly when asked. Pt able to zig-zag without ankle rolling. Pt neg stairs alternating pattern 11 steps 1 rep 2 rails & 2nd left rail.  Pt's ankle appears stable but noted decreased range. PT recommended if no rails use step-to pattern. Pt verbalized understanding.  PT advised okay to not use ASO on paved even surfaces.  If going on grass, gravel or sand, she should use ASO.  Take ASO to work. If notices ankle is tired/fatigued, put ASO on ankle.  Pt verbalizes understanding.   Neuromuscular RE-ed: Tandem forward & back on foam beam including stepping on/off beam 3 laps without UE support. Sidestepping right/left on foam beam including stepping on/off beam 3 laps without UE support. Braiding on floor 3 laps without UE support.  02/20/2022: TherEx: no ASO in clinic Leg Press: 62# bil LE's 2 x 15 Leg Press: Rt LE only: 31# x 15 Leg Press: calf raises bilateral: 25# X 15 Leg Press: Rt calf rasises 50# x 15 Gastroc stretch on step heel depression 30 sec 2 reps BLE heel raises sliding pillow case upward on mirror 15 reps 2 sets. BLE heel raise with arc motion for pronation / supination motion of ankle 15 reps 2 sets Heel raises BLE concentric & RLE eccentric with BUE support 10 reps Green theraband RLE long sit PF 15 reps, PF with inversion 15 reps, PF with eversion 15 reps PT updated HEP to include standing exercises.  See  below. Pt verbalized & return demo understanding.   Therapeutic Activities: Stairs 2 rails 1 flight 11 steps alternating pattern with PT demo & verbal cues on technique ankle motion with supervision  Neuromuscular Re-ed: Tandem stance RLE in front & in back 30 sec ea.  RLE SLS on Airex mat without UE support toe touch from left foot to 3 cones progressively crossing over RLE 15 reps.   Modalities:  Vasopneumatic: right ankle / foot 34 deg, medium compression, x 10 minutes        PATIENT EDUCATION:  Education details: HEP, POC Person educated: Patient Education method: Explanation, Demonstration, Verbal cues, and  Handouts Education comprehension: verbalized understanding, returned demonstration, and verbal cues required   HOME EXERCISE PROGRAM: Access Code: [email or web address] URL: [email or web address] Date: 02/20/2022 Prepared by: Jamey Reas  Exercises - Seated Ankle Alphabet  - 3 x daily - 7 x weekly - Towel Scrunches  - 2 x daily - 7 x weekly - 5 reps - Ankle Inversion Eversion Towel Slide  - 3 x daily - 7 x weekly - 10 reps - 2 seconds hold - Long Sitting Calf Stretch with Strap  - 3 x daily - 7 x weekly - 3 reps - 30 seconds hold - Ankle Dorsiflexion with Resistance  - 2 x daily - 7 x weekly - 2 sets - 10 reps - Ankle Eversion with Resistance  - 2 x daily - 7 x weekly - 2 sets - 10 reps - Ankle Inversion with Resistance  - 2 x daily - 7 x weekly - 2 sets - 10 reps - Ankle and Toe Plantarflexion with Resistance  - 2 x daily - 7 x weekly - 2 sets - 10 reps - Long Sitting Ankle Plantarflexion with inversion & eversion  - 1 x daily - 7 x weekly - 1 sets - 10 reps - 5 seconds hold - Gastroc Stretch on Step  - 2 x daily - 7 x weekly - 1 sets - 3 reps - 20-30 seconds hold - Standing Heel Raises  - 1 x daily - 7 x weekly - 1-2 sets - 10-15 reps - 5 seconds hold - Toe raises in Corner  - 1 x daily - 7 x weekly - 2-3 sets - 10 reps - 5 seconds hold - Standing Eccentric Heel  Raise  - 1 x daily - 7 x weekly - 1-2 sets - 10-15 reps - 5 seconds hold - stair climbing using alternating pattern with 2 rails  - 1-2 x daily - 7 x weekly - 1 sets - 1 reps   ASSESSMENT:   CLINICAL IMPRESSION:  Pt arriving today still reporting 3-4/10. Pt has made improvements in strength, ROM and functional mobility. Continue skilled PT to maximize pt's function and progress toward LTG's.    OBJECTIVE IMPAIRMENTS: Abnormal gait, decreased activity tolerance, decreased balance, decreased mobility, difficulty walking, decreased ROM, decreased strength, increased edema, and pain.    ACTIVITY LIMITATIONS: bending, sitting, standing, squatting, sleeping, transfers, and dressing   PARTICIPATION LIMITATIONS: cleaning, driving, shopping, community activity, and occupation   PERSONAL FACTORS: 1-2 comorbidities: see pertinent history above  are also affecting patient's functional outcome.    REHAB POTENTIAL: Excellent   CLINICAL DECISION MAKING: Stable/uncomplicated   EVALUATION COMPLEXITY: Low     GOALS: Goals reviewed with patient? Yes   UPDATED SHORT TERM GOALS: (target date for Short term goals 03/01/22)    1.  Patient will demonstrate understanding of updated home exercise program to maintain progress from in clinic treatments.   Goal status: ongoing 02/20/2022   2.  Patient reports ambulate in community with cane or less & ASO without any issues.   Goal status: ongoing 02/20/2022   LONG TERM GOALS: (target dates for all long term goals are 10 weeks  03/29/22 )   1. Patient will demonstrate/report pain at worst less than or equal to 2/10 to facilitate minimal limitation in daily activity secondary to pain symptoms.   Goal status: On-going 03/05/22   2. Patient will demonstrate independent use of home exercise program to facilitate ability to maintain/progress functional gains from skilled physical therapy services.   Goal  status: On-going 03/05/22   3. Patient will demonstrate FOTO  outcome > or = 66 % to indicate reduced disability due to condition.   Goal status: MET 03/05/22   4.  Patient will demonstrate >/= 4 LE MMT throughout to faciltiate usual transfers, stairs, squatting at [facility] for daily life.    Goal status: On-going 03/05/22   5.  Patient will be able to amb community distance with no device with step through gait pattern without antalgic gait pattern.  Goal status: partially met 03/05/22   6.  Pt will be able to navigate up and down 1 flight of stairs with single hand rail with step over step pattern.  Goal status: On-going 03/05/22   7.  Pt will improve Rt ankle DF to >/= 15 degrees actively for improved gait and functional mobility.  Goal Status: On-going 03/05/22   PLAN:   PT FREQUENCY: 1-2x/week   PT DURATION: 10 weeks   PLANNED INTERVENTIONS: Therapeutic exercises, Therapeutic activity, Neuro Muscular re-education, Balance training, Gait training, Patient/Family education, Joint mobilization, Stair training, DME instructions, Dry Needling, Electrical stimulation, Traction, Cryotherapy, vasopneumatic deviceMoist heat, Taping, Ultrasound, Ionotophoresis 4mg /ml Dexamethasone, and Manual therapy.  All included unless contraindicated   PLAN FOR NEXT SESSION:  Progress pt's HEP       , PT, MPT 03/05/22 12:40 PM   03/05/2022, 12:40 PM

## 2022-03-07 ENCOUNTER — Ambulatory Visit (INDEPENDENT_AMBULATORY_CARE_PROVIDER_SITE_OTHER): Payer: Managed Care, Other (non HMO) | Admitting: Orthopedic Surgery

## 2022-03-07 DIAGNOSIS — Z8781 Personal history of (healed) traumatic fracture: Secondary | ICD-10-CM

## 2022-03-07 DIAGNOSIS — S82891A Other fracture of right lower leg, initial encounter for closed fracture: Secondary | ICD-10-CM

## 2022-03-07 DIAGNOSIS — Z9889 Other specified postprocedural states: Secondary | ICD-10-CM

## 2022-03-11 ENCOUNTER — Encounter: Payer: Self-pay | Admitting: Physical Therapy

## 2022-03-11 ENCOUNTER — Ambulatory Visit: Payer: Managed Care, Other (non HMO) | Admitting: Physical Therapy

## 2022-03-11 DIAGNOSIS — R262 Difficulty in walking, not elsewhere classified: Secondary | ICD-10-CM | POA: Diagnosis not present

## 2022-03-11 DIAGNOSIS — R600 Localized edema: Secondary | ICD-10-CM | POA: Diagnosis not present

## 2022-03-11 DIAGNOSIS — M25671 Stiffness of right ankle, not elsewhere classified: Secondary | ICD-10-CM | POA: Diagnosis not present

## 2022-03-11 DIAGNOSIS — M25571 Pain in right ankle and joints of right foot: Secondary | ICD-10-CM | POA: Diagnosis not present

## 2022-03-11 NOTE — Therapy (Signed)
OUTPATIENT PHYSICAL THERAPY TREATMENT NOTE   Patient Name: Elizabeth Perry MRN: [id number] DOB:October 28, 1970, 52 y.o., female Today's Date: 03/11/2022  PCP: Lawerance Cruel, MD  REFERRING PROVIDER: Newt Minion, MD     END OF SESSION:   PT End of Session - 03/11/22 1515     Visit Number 12    Number of Visits 20    Date for PT Re-Evaluation 03/29/22    Authorization Type [employer] Managed    Authorization Time Period $60 co-pay, 30 visit limit    Authorization - Number of Visits 30    Progress Note Due on Visit 21    PT Start Time 1516    PT Stop Time 1555    PT Time Calculation (min) 39 min    Activity Tolerance Patient tolerated treatment well    Behavior During Therapy WFL for tasks assessed/performed                     Past Medical History:  Diagnosis Date   Asthma    Headache    Hypertension    Past Surgical History:  Procedure Laterality Date   COLONOSCOPY     MOUTH SURGERY     ORIF ANKLE FRACTURE Right 12/28/2021   Procedure: OPEN REDUCTION INTERNAL FIXATION (ORIF) RIGHT ANKLE FRACTURE;  Surgeon: Newt Minion, MD;  Location: [facility];  Service: Orthopedics;  Laterality: Right;   Patient Active Problem List   Diagnosis Date Noted   Closed fracture of right ankle 12/28/2021    REFERRING DIAG:  Z98.890,Z87.81 (ICD-10-CM) - S/P ORIF (open reduction internal fixation) fracture   THERAPY DIAG:  Acute right ankle pain  Stiffness of right ankle, not elsewhere classified  Difficulty in walking, not elsewhere classified  Localized edema  Rationale for Evaluation and Treatment Rehabilitation  PERTINENT HISTORY: Asthma, HA, HTN   PRECAUTIONS: none  SUBJECTIVE:                                                                                                                                                                                      SUBJECTIVE STATEMENT:   She is able to function without ASO with no instability. .  She gets  intermittent sharp pains even seated that last couple seconds.   She continues to do her exercises.   PAIN:  Are you having pain? Yes, today   0.5-1/10  since last PT highest 2/10  OBJECTIVE: (objective measures completed at initial evaluation unless otherwise dated)  DIAGNOSTIC FINDINGS:  01/10/22 Narrative:  Three-view radiographs of the right ankle shows stable internal fixation of the Weber B fibular fracture.  The mortise is congruent.  PATIENT SURVEYS:  01/14/22: FOTO intake:   36% 02/13/22: FOTO 63% 03/05/22: FOTO 76%   COGNITION: Overall cognitive status: WFL                  SENSATION: 01/14/22: WFL   EDEMA:  01/14/22:  Circumferential: Rt: 29 centimeters around malleoli Left: 23 centimeters        POSTURE: No Significant postural limitations   PALPATION: TTP; lateral ankle around lateral malleleus   LOWER EXTREMITY ROM:    ROM Right eval Left eval Rt 01/29/22 Right 02/05/22 Rt 02/11/22 Right 02/20/22 Rt 03/05/22  Hip flexion           Hip extension           Hip abduction           Hip adduction           Hip internal rotation           Hip external rotation           Knee flexion           Knee extension           Ankle dorsiflexion A: 0  P: 4 A: 15 P: 20 A: 4 Seated Knee ext P: 20* A: 5* Knee flex P: 22* A: 8* Seated A: 5 Knee flexed Seated Knee ext P: 22* A: 12*  Seated Knee ext  A: 14 P: 20  Ankle plantarflexion A: 12 P: 15 A: 25 A: 16 Seated P: 26* A: 18* Seated A: 18 Seated A: 22* Seated: A: 22  Ankle inversion A: 10 P: 12 A: 30 A: 14 Seated With forefoot add P: 32* A: 22* Prone Hindfoot P:12* Seated A: 24  Seated A: 26 P: 30  Ankle eversion A: 14 P: 18 A: 34 A: 16 Seated forefoot abd P: 22* A: 20* Prone hindfoot 6* Seated A: 24  Seated  A: 30 P: 35   (Blank rows = not tested)   LOWER EXTREMITY MMT:   MMT Right eval Left eval  Hip flexion 5/5 5/5  Hip extension      Hip abduction 5/5 5/5  Hip adduction  5/5 5/5  Hip internal rotation      Hip external rotation      Knee flexion 4/5 5/5  Knee extension 4/5 5/5  Ankle dorsiflexion 2/5 5/5  Ankle plantarflexion 2/5 5/5  Ankle inversion 2/5 5/5  Ankle eversion 2/5 5/5   (Blank rows = not tested)     GAIT: 02/27/2022: Pt amb without ASO >500', neg ramp & curb with no LOB but noted decrease in ankle DF. Pt able to pick up speed slightly when asked. Pt able to zig-zag without ankle rolling. Pt neg stairs alternating pattern 11 steps 1 rep 2 rails & 2nd left rail.  Pt's ankle appears stable but noted decreased range. PT recommended if no rails use step-to pattern. Pt verbalized understanding.  PT advised okay to not use ASO on paved even surfaces.  If going on grass, gravel or sand, she should use ASO.  Take ASO to work. If notices ankle is tired/fatigued, put ASO on ankle.  Pt verbalizes understanding.   01/14/2022: Distance walked: 3 feet  Assistive device utilized: CAM boot on Rt Level of assistance: Modified independence Comments: antalgic gait pattern with UE support on mat table     TODAY'S TREATMENT  DATE: 03/11/2022: Therapeutic exercise: no ASO Recumbent bike seat 3 level 3 for 6 min. PT updated HEP with braiding, tandem forward/backward, heel walking forward/backward, toe walking forward/backward, stairs alternating pattern descend one rail & ascend no rail, stairs side stepping step-to pattern leading with right & left for pronation/supination, RLE stance on foam without shoe reaching LLE Y and RLE stance with shoe rolling soccer ball around [place].  PT demo, verbal & HO cues.  Pt verbalized & return demo understanding.    03/05/22:  TherEx:  Nustep no UE's Level 5 x 6 minutes Calf stretch: slant board x 3 holding 40 sec Leg Press: bilateral LE's 75# x 15 Leg Press: Rt LE only 50# x 15 Leg Press: bil calf raises  x 15 BOSU ball dome up: both legs x 60  sec BOSU ball dome  up SLS: Rt LE x 30 sec x 2 BOSU ball dome up tandem stance: each foot forward x 30 seconds (Pt requiring intermittent finger taps for balance) Blaze pods: SLS with 3 pods placed in semicircle in front of pt on level ground est time was 26 tap Blaze pods placed about 10 feet apart in triangle pattern (6 taps)  02/27/2022: Therapeutic exercise: no ASO Recumbent bike seat 3 level 1 for 6 min. Heel raises without UE support BLEs concentric & RLE isometric/eccentric 10 reps Gastroc- Soleus stretch on step heel depression knee straight 30 sec & knee flexed 30 sec Inversion stretch standing with weight on RLE medial foot on 1" thick towel 30 sec Eversion stretch standing with weight on RLE lateral foot on 1" thick towel 30 sec Pt verbalized understanding of above stretches as HEP.  BAPs standing RLE level 1 DF/PF, inv/ever & circles CW/CCW 10 reps ea.   Therapeutic Activities: Pt amb without ASO >500', neg ramp & curb with no LOB but noted decrease in ankle DF. Pt able to pick up speed slightly when asked. Pt able to zig-zag without ankle rolling. Pt neg stairs alternating pattern 11 steps 1 rep 2 rails & 2nd left rail.  Pt's ankle appears stable but noted decreased range. PT recommended if no rails use step-to pattern. Pt verbalized understanding.  PT advised okay to not use ASO on paved even surfaces.  If going on grass, gravel or sand, she should use ASO.  Take ASO to work. If notices ankle is tired/fatigued, put ASO on ankle.  Pt verbalizes understanding.   Neuromuscular RE-ed: Tandem forward & back on foam beam including stepping on/off beam 3 laps without UE support. Sidestepping right/left on foam beam including stepping on/off beam 3 laps without UE support. Braiding on floor 3 laps without UE support.      PATIENT EDUCATION:  Education details: HEP, POC Person educated: Patient Education method: Consulting civil engineer, Demonstration, Verbal cues, and Handouts Education  comprehension: verbalized understanding, returned demonstration, and verbal cues required   HOME EXERCISE PROGRAM: Access Code: [email or web address] URL: [email or web address] Date: 03/11/2022 Prepared by: Jamey Reas  Exercises - Seated Ankle Alphabet  - 3 x daily - 7 x weekly - Towel Scrunches  - 2 x daily - 7 x weekly - 5 reps - Ankle Inversion Eversion Towel Slide  - 3 x daily - 7 x weekly - 10 reps - 2 seconds hold - Long Sitting Calf Stretch with Strap  - 3 x daily - 7 x weekly - 3 reps - 30 seconds hold - Ankle Dorsiflexion with Resistance  - 2 x daily - 7 x weekly -  2 sets - 10 reps - Ankle Eversion with Resistance  - 2 x daily - 7 x weekly - 2 sets - 10 reps - Ankle Inversion with Resistance  - 2 x daily - 7 x weekly - 2 sets - 10 reps - Ankle and Toe Plantarflexion with Resistance  - 2 x daily - 7 x weekly - 2 sets - 10 reps - Long Sitting Ankle Plantarflexion with inversion & eversion  - 1 x daily - 7 x weekly - 1 sets - 10 reps - 5 seconds hold - Gastroc Stretch on Step  - 2 x daily - 7 x weekly - 1 sets - 3 reps - 20-30 seconds hold - Standing Heel Raises  - 1 x daily - 7 x weekly - 1-2 sets - 10-15 reps - 5 seconds hold - Toe raises in Corner  - 1 x daily - 7 x weekly - 2-3 sets - 10 reps - 5 seconds hold - Standing Eccentric Heel Raise  - 1 x daily - 7 x weekly - 1-2 sets - 10-15 reps - 5 seconds hold - stair climbing using alternating pattern with 2 rails  - 1-2 x daily - 7 x weekly - 1 sets - 1 reps - Carioca with Counter Support  - 1 x daily - 5 x weekly - 3 sets - 10 reps - Tandem Walking with Counter Support  - 1 x daily - 5 x weekly - 3 sets - 10 reps - Backward Tandem Walking with Counter Support  - 1 x daily - 5 x weekly - 3 sets - 10 reps - Toe Walking with Counter Support  - 1 x daily - 5 x weekly - 3 sets - 10 reps - Heel Walking  - 1 x daily - 5 x weekly - 3 sets - 10 reps - Single Leg Stance with 3-Way Kick on Foam  - 1 x daily - 5 x weekly - 1 sets - 10  reps   ASSESSMENT:   CLINICAL IMPRESSION:  PT advanced HEP to include standing strength & balance. She appears to understand.  She continues to benefit from weekly instruction to advance activities for her ankle.     OBJECTIVE IMPAIRMENTS: Abnormal gait, decreased activity tolerance, decreased balance, decreased mobility, difficulty walking, decreased ROM, decreased strength, increased edema, and pain.    ACTIVITY LIMITATIONS: bending, sitting, standing, squatting, sleeping, transfers, and dressing   PARTICIPATION LIMITATIONS: cleaning, driving, shopping, community activity, and occupation   PERSONAL FACTORS: 1-2 comorbidities: see pertinent history above  are also affecting patient's functional outcome.    REHAB POTENTIAL: Excellent   CLINICAL DECISION MAKING: Stable/uncomplicated   EVALUATION COMPLEXITY: Low     GOALS: Goals reviewed with patient? Yes   UPDATED SHORT TERM GOALS: (target date for Short term goals 03/01/22)    1.  Patient will demonstrate understanding of updated home exercise program to maintain progress from in clinic treatments.   Goal status:   MET 03/11/2022   2.  Patient reports ambulate in community with cane or less & ASO without any issues.   Goal status: MET 03/11/2022   LONG TERM GOALS: (target dates for all long term goals are 10 weeks  03/29/22 )   1. Patient will demonstrate/report pain at worst less than or equal to 2/10 to facilitate minimal limitation in daily activity secondary to pain symptoms.   Goal status: On-going 03/05/22   2. Patient will demonstrate independent use of home exercise program to facilitate ability  to maintain/progress functional gains from skilled physical therapy services.   Goal status: On-going 03/05/22   3. Patient will demonstrate FOTO outcome > or = 66 % to indicate reduced disability due to condition.   Goal status: MET 03/05/22   4.  Patient will demonstrate >/= 4 LE MMT throughout to faciltiate usual transfers,  stairs, squatting at [facility] for daily life.    Goal status: On-going 03/05/22   5.  Patient will be able to amb community distance with no device with step through gait pattern without antalgic gait pattern.  Goal status: partially met 03/05/22   6.  Pt will be able to navigate up and down 1 flight of stairs with single hand rail with step over step pattern.  Goal status: On-going 03/05/22   7.  Pt will improve Rt ankle DF to >/= 15 degrees actively for improved gait and functional mobility.  Goal Status: On-going 03/05/22   PLAN:   PT FREQUENCY: 1-2x/week   PT DURATION: 10 weeks   PLANNED INTERVENTIONS: Therapeutic exercises, Therapeutic activity, Neuro Muscular re-education, Balance training, Gait training, Patient/Family education, Joint mobilization, Stair training, DME instructions, Dry Needling, Electrical stimulation, Traction, Cryotherapy, vasopneumatic deviceMoist heat, Taping, Ultrasound, Ionotophoresis 22m/ml Dexamethasone, and Manual therapy.  All included unless contraindicated   PLAN FOR NEXT SESSION:  Check HEP on 2/12 & advance as indicated.      RJamey Reas PT, DPT 03/11/2022, 4:13 PM

## 2022-03-19 ENCOUNTER — Ambulatory Visit: Payer: Managed Care, Other (non HMO) | Admitting: Physical Therapy

## 2022-03-19 ENCOUNTER — Encounter: Payer: Self-pay | Admitting: Physical Therapy

## 2022-03-19 ENCOUNTER — Encounter: Payer: Self-pay | Admitting: Orthopedic Surgery

## 2022-03-19 DIAGNOSIS — M25671 Stiffness of right ankle, not elsewhere classified: Secondary | ICD-10-CM | POA: Diagnosis not present

## 2022-03-19 DIAGNOSIS — M25571 Pain in right ankle and joints of right foot: Secondary | ICD-10-CM | POA: Diagnosis not present

## 2022-03-19 DIAGNOSIS — R262 Difficulty in walking, not elsewhere classified: Secondary | ICD-10-CM

## 2022-03-19 DIAGNOSIS — R600 Localized edema: Secondary | ICD-10-CM

## 2022-03-19 NOTE — Progress Notes (Signed)
Office Visit Note   Patient: Elizabeth Perry           Date of Birth: Oct 19, 1970           MRN: [id number] Visit Date: 03/07/2022              Requested by: Lawerance Cruel, [place],  [place] [id number] PCP: Lawerance Cruel, MD  Chief Complaint  Patient presents with   Right Ankle - Routine Post Op    12/28/2021 right ankle ORIF       HPI: Patient is a 52 year old woman who is 2 months status post open reduction internal fixation right ankle fracture she has been doing physical therapy 1-2 times a week.  She is not using her brace she is making good progress.  Assessment & Plan: Visit Diagnoses:  1. S/P ORIF (open reduction internal fixation) fracture   2. Closed fracture of right ankle, initial encounter     Plan: Patient will continue to increase her activities as tolerated.  Recommend she continue her scar massage and strengthening.  Follow-Up Instructions: Return if symptoms worsen or fail to improve.   Ortho Exam  Patient is alert, oriented, no adenopathy, well-dressed, normal affect, normal respiratory effort. Examination patient has good range of motion of her ankle there is no redness no cellulitis no signs of infection.  No sensitivity to light touch.  She still has some swelling laterally.  Imaging: No results found. No images are attached to the encounter.  Labs: No results found for: "HGBA1C", "ESRSEDRATE", "CRP", "LABURIC", "REPTSTATUS", "GRAMSTAIN", "CULT", "LABORGA"   Lab Results  Component Value Date   ALBUMIN 3.9 11/28/2011    No results found for: "MG" No results found for: "VD25OH"  No results found for: "PREALBUMIN"    Latest Ref Rng & Units 12/28/2021    9:38 AM 11/28/2011   12:52 PM  CBC EXTENDED  WBC 4.0 - 10.5 K/uL 6.3  4.0   RBC 3.87 - 5.11 MIL/uL 4.64  4.63   Hemoglobin 12.0 - 15.0 g/dL 13.5  13.5   HCT 36.0 - 46.0 % 40.8  39.8   Platelets 150 - 400 K/uL 250  226   NEUT# 1.7 - 7.7 K/uL  1.4    Lymph# 0.7 - 4.0 K/uL  2.1      There is no height or weight on file to calculate BMI.  Orders:  No orders of the defined types were placed in this encounter.  No orders of the defined types were placed in this encounter.    Procedures: No procedures performed  Clinical Data: No additional findings.  ROS:  All other systems negative, except as noted in the HPI. Review of Systems  Objective: Vital Signs: There were no vitals taken for this visit.  Specialty Comments:  No specialty comments available.  PMFS History: Patient Active Problem List   Diagnosis Date Noted   Closed fracture of right ankle 12/28/2021   Past Medical History:  Diagnosis Date   Asthma    Headache    Hypertension     Family History  Problem Relation Age of Onset   Hypertension Mother    Diabetes Mother    Diabetes Father    Hypertension Father    Hypertension Sister    Hypertension Brother     Past Surgical History:  Procedure Laterality Date   COLONOSCOPY     MOUTH SURGERY     ORIF ANKLE FRACTURE Right 12/28/2021   Procedure:  OPEN REDUCTION INTERNAL FIXATION (ORIF) RIGHT ANKLE FRACTURE;  Surgeon: Newt Minion, MD;  Location: [facility];  Service: Orthopedics;  Laterality: Right;   Social History   Occupational History   Not on file  Tobacco Use   Smoking status: Former    Types: Cigarettes   Smokeless tobacco: Not on file  Vaping Use   Vaping Use: Never used  Substance and Sexual Activity   Alcohol use: No   Drug use: No   Sexual activity: Not on file

## 2022-03-19 NOTE — Therapy (Addendum)
OUTPATIENT PHYSICAL THERAPY TREATMENT NOTE /DISCHARGE   Patient Name: Elizabeth Perry MRN: [id number] DOB:[birth year], 52 y.o., female Today's Date: 03/19/2022  PCP: Lawerance Cruel, MD  REFERRING PROVIDER: Newt Minion, MD     END OF SESSION:   PT End of Session - 03/19/22 1146     Visit Number 13    Number of Visits 20    Date for PT Re-Evaluation 03/29/22    Authorization Type [employer] Managed    Authorization Time Period $60 co-pay, 30 visit limit    Authorization - Number of Visits 30    Progress Note Due on Visit 21    PT Start Time 1146    PT Stop Time 1224    PT Time Calculation (min) 38 min    Activity Tolerance Patient tolerated treatment well    Behavior During Therapy WFL for tasks assessed/performed                      Past Medical History:  Diagnosis Date   Asthma    Headache    Hypertension    Past Surgical History:  Procedure Laterality Date   COLONOSCOPY     MOUTH SURGERY     ORIF ANKLE FRACTURE Right 12/28/2021   Procedure: OPEN REDUCTION INTERNAL FIXATION (ORIF) RIGHT ANKLE FRACTURE;  Surgeon: Newt Minion, MD;  Location: [facility];  Service: Orthopedics;  Laterality: Right;   Patient Active Problem List   Diagnosis Date Noted   Closed fracture of right ankle 12/28/2021    REFERRING DIAG:  Z98.890,Z87.81 (ICD-10-CM) - S/P ORIF (open reduction internal fixation) fracture   THERAPY DIAG:  Acute right ankle pain  Difficulty in walking, not elsewhere classified  Stiffness of right ankle, not elsewhere classified  Localized edema  Rationale for Evaluation and Treatment Rehabilitation  PERTINENT HISTORY: Asthma, HA, HTN   PRECAUTIONS: none  SUBJECTIVE:                                                                                                                                                                                      SUBJECTIVE STATEMENT:  The past couple of days the ankle has been bothering her & is more  swollen.  She walked more on Saturday than normal and some grass.    PAIN:  Are you having pain? Yes, today 2-3/10  since last PT 0/10 up to 5/10 right lateral ankle  OBJECTIVE: (objective measures completed at initial evaluation unless otherwise dated)  DIAGNOSTIC FINDINGS:  01/10/22 Narrative:  Three-view radiographs of the right ankle shows stable internal fixation of the Weber B fibular fracture.  The mortise is congruent.  PATIENT SURVEYS:  01/14/22: FOTO intake:   36% 02/13/22: FOTO 63% 03/05/22: FOTO 76%   COGNITION: Overall cognitive status: WFL                  SENSATION: 01/14/22: WFL   EDEMA:  01/14/22:  Circumferential: Rt: 29 centimeters around malleoli Left: 23 centimeters        POSTURE: No Significant postural limitations   PALPATION: TTP; lateral ankle around lateral malleleus   LOWER EXTREMITY ROM:    ROM Right eval Left eval Rt 01/29/22 Right 02/05/22 Rt 02/11/22 Right 02/20/22 Rt 03/05/22  Hip flexion           Hip extension           Hip abduction           Hip adduction           Hip internal rotation           Hip external rotation           Knee flexion           Knee extension           Ankle dorsiflexion A: 0  P: 4 A: 15 P: 20 A: 4 Seated Knee ext P: 20* A: 5* Knee flex P: 22* A: 8* Seated A: 5 Knee flexed Seated Knee ext P: 22* A: 12*  Seated Knee ext  A: 14 P: 20  Ankle plantarflexion A: 12 P: 15 A: 25 A: 16 Seated P: 26* A: 18* Seated A: 18 Seated A: 22* Seated: A: 22  Ankle inversion A: 10 P: 12 A: 30 A: 14 Seated With forefoot add P: 32* A: 22* Prone Hindfoot P:12* Seated A: 24  Seated A: 26 P: 30  Ankle eversion A: 14 P: 18 A: 34 A: 16 Seated forefoot abd P: 22* A: 20* Prone hindfoot 6* Seated A: 24  Seated  A: 30 P: 35   (Blank rows = not tested)   LOWER EXTREMITY MMT:   MMT Right eval Left eval  Hip flexion 5/5 5/5  Hip extension      Hip abduction 5/5 5/5  Hip adduction 5/5 5/5  Hip  internal rotation      Hip external rotation      Knee flexion 4/5 5/5  Knee extension 4/5 5/5  Ankle dorsiflexion 2/5 5/5  Ankle plantarflexion 2/5 5/5  Ankle inversion 2/5 5/5  Ankle eversion 2/5 5/5   (Blank rows = not tested)     GAIT: 02/27/2022: Pt amb without ASO >500', neg ramp & curb with no LOB but noted decrease in ankle DF. Pt able to pick up speed slightly when asked. Pt able to zig-zag without ankle rolling. Pt neg stairs alternating pattern 11 steps 1 rep 2 rails & 2nd left rail.  Pt's ankle appears stable but noted decreased range. PT recommended if no rails use step-to pattern. Pt verbalized understanding.  PT advised okay to not use ASO on paved even surfaces.  If going on grass, gravel or sand, she should use ASO.  Take ASO to work. If notices ankle is tired/fatigued, put ASO on ankle.  Pt verbalizes understanding.   01/14/2022: Distance walked: 3 feet  Assistive device utilized: CAM boot on Rt Level of assistance: Modified independence Comments: antalgic gait pattern with UE support on mat table     TODAY'S TREATMENT  DATE: 03/19/2022: PT noted that pt was sitting in waiting area with feet / ankle in same position.  Previously positioned RLE in protective position. Therapeutic exercise: no ASO Recumbent bike seat 3 level 3 for 8 min. Standing on foam sock footed. No UE support but //bars close as needed.  RLE SLS reaching LLE towards 5 cones (across midline anteriorly, ant-lat, lat, post-lat, across midline posteriorly) 10 reps 2 sets.  Standing heel raises & toe raises 10 reps 2 sets straight motion. Then 10 reps heel raises reaching to right & to left 5 reps ea. Then toes raises moving pelvis to right & to left 5 reps ea. Braiding right & left without UE support.  PT added above exercises to HEP. Pt verbalized understanding.   PT demo & verbal cues on using Yoga poses to strengthen ankle. Pt  verbalized understanding with plans to try class at work.  Self-care: Pt reports top of shoe rubs lateral malleolus. PT used internet to recommend adjust a lift (small) to lift heel up to 3/8" out of shoe.  Pt verbalized understanding.   03/11/2022: Therapeutic exercise: no ASO Recumbent bike seat 3 level 3 for 6 min. PT updated HEP with braiding, tandem forward/backward, heel walking forward/backward, toe walking forward/backward, stairs alternating pattern descend one rail & ascend no rail, stairs side stepping step-to pattern leading with right & left for pronation/supination, RLE stance on foam without shoe reaching LLE Y and RLE stance with shoe rolling soccer ball around [place].  PT demo, verbal & HO cues.  Pt verbalized & return demo understanding.    03/05/22:  TherEx:  Nustep no UE's Level 5 x 6 minutes Calf stretch: slant board x 3 holding 40 sec Leg Press: bilateral LE's 75# x 15 Leg Press: Rt LE only 50# x 15 Leg Press: bil calf raises  x 15 BOSU ball dome up: both legs x 60 sec BOSU ball dome  up SLS: Rt LE x 30 sec x 2 BOSU ball dome up tandem stance: each foot forward x 30 seconds (Pt requiring intermittent finger taps for balance) Blaze pods: SLS with 3 pods placed in semicircle in front of pt on level ground est time was 26 tap Blaze pods placed about 10 feet apart in triangle pattern (6 taps)     PATIENT EDUCATION:  Education details: HEP, POC Person educated: Patient Education method: Consulting civil engineer, Demonstration, Verbal cues, and Handouts Education comprehension: verbalized understanding, returned demonstration, and verbal cues required   HOME EXERCISE PROGRAM: Access Code: [email or web address] URL: [email or web address] Date: 03/11/2022 Prepared by: Jamey Reas  Exercises - Seated Ankle Alphabet  - 3 x daily - 7 x weekly - Towel Scrunches  - 2 x daily - 7 x weekly - 5 reps - Ankle Inversion Eversion Towel Slide  - 3 x daily - 7 x weekly - 10 reps - 2  seconds hold - Long Sitting Calf Stretch with Strap  - 3 x daily - 7 x weekly - 3 reps - 30 seconds hold - Ankle Dorsiflexion with Resistance  - 2 x daily - 7 x weekly - 2 sets - 10 reps - Ankle Eversion with Resistance  - 2 x daily - 7 x weekly - 2 sets - 10 reps - Ankle Inversion with Resistance  - 2 x daily - 7 x weekly - 2 sets - 10 reps - Ankle and Toe Plantarflexion with Resistance  - 2 x daily - 7 x weekly - 2 sets - 10 reps -  Long Sitting Ankle Plantarflexion with inversion & eversion  - 1 x daily - 7 x weekly - 1 sets - 10 reps - 5 seconds hold - Gastroc Stretch on Step  - 2 x daily - 7 x weekly - 1 sets - 3 reps - 20-30 seconds hold - Standing Heel Raises  - 1 x daily - 7 x weekly - 1-2 sets - 10-15 reps - 5 seconds hold - Toe raises in Corner  - 1 x daily - 7 x weekly - 2-3 sets - 10 reps - 5 seconds hold - Standing Eccentric Heel Raise  - 1 x daily - 7 x weekly - 1-2 sets - 10-15 reps - 5 seconds hold - stair climbing using alternating pattern with 2 rails  - 1-2 x daily - 7 x weekly - 1 sets - 1 reps - Carioca with Counter Support  - 1 x daily - 5 x weekly - 3 sets - 10 reps - Tandem Walking with Counter Support  - 1 x daily - 5 x weekly - 3 sets - 10 reps - Backward Tandem Walking with Counter Support  - 1 x daily - 5 x weekly - 3 sets - 10 reps - Toe Walking with Counter Support  - 1 x daily - 5 x weekly - 3 sets - 10 reps - Heel Walking  - 1 x daily - 5 x weekly - 3 sets - 10 reps - Single Leg Stance with 3-Way Kick on Foam  - 1 x daily - 5 x weekly - 1 sets - 10 reps   ASSESSMENT: CLINICAL IMPRESSION:  Patient feels some of ankle pain may be from lateral malleolus rubbing top of shoe.  A heel lift would raise malleolus to help which she plans to purchase.  She appears to understand updated HEP including benefits of Yoga.     OBJECTIVE IMPAIRMENTS: Abnormal gait, decreased activity tolerance, decreased balance, decreased mobility, difficulty walking, decreased ROM, decreased  strength, increased edema, and pain.    ACTIVITY LIMITATIONS: bending, sitting, standing, squatting, sleeping, transfers, and dressing   PARTICIPATION LIMITATIONS: cleaning, driving, shopping, community activity, and occupation   PERSONAL FACTORS: 1-2 comorbidities: see pertinent history above  are also affecting patient's functional outcome.    REHAB POTENTIAL: Excellent   CLINICAL DECISION MAKING: Stable/uncomplicated   EVALUATION COMPLEXITY: Low     GOALS: Goals reviewed with patient? Yes   UPDATED SHORT TERM GOALS: (target date for Short term goals 03/01/22)    1.  Patient will demonstrate understanding of updated home exercise program to maintain progress from in clinic treatments.   Goal status:   MET 03/11/2022   2.  Patient reports ambulate in community with cane or less & ASO without any issues.   Goal status: MET 03/11/2022   LONG TERM GOALS: (target dates for all long term goals are 10 weeks  03/29/22 )   1. Patient will demonstrate/report pain at worst less than or equal to 2/10 to facilitate minimal limitation in daily activity secondary to pain symptoms.   Goal status: On-going 03/05/22   2. Patient will demonstrate independent use of home exercise program to facilitate ability to maintain/progress functional gains from skilled physical therapy services.   Goal status: On-going 03/05/22   3. Patient will demonstrate FOTO outcome > or = 66 % to indicate reduced disability due to condition.   Goal status: MET 03/05/22   4.  Patient will demonstrate >/= 4 LE MMT throughout to faciltiate usual transfers,  stairs, squatting at PLOF for daily life.    Goal status: On-going 03/05/22   5.  Patient will be able to amb community distance with no device with step through gait pattern without antalgic gait pattern.  Goal status: partially met 03/05/22   6.  Pt will be able to navigate up and down 1 flight of stairs with single hand rail with step over step pattern.  Goal status:  On-going 03/05/22   7.  Pt will improve Rt ankle DF to >/= 15 degrees actively for improved gait and functional mobility.  Goal Status: On-going 03/05/22   PLAN:   PT FREQUENCY: 1-2x/week   PT DURATION: 10 weeks   PLANNED INTERVENTIONS: Therapeutic exercises, Therapeutic activity, Neuro Muscular re-education, Balance training, Gait training, Patient/Family education, Joint mobilization, Stair training, DME instructions, Dry Needling, Electrical stimulation, Traction, Cryotherapy, vasopneumatic deviceMoist heat, Taping, Ultrasound, Ionotophoresis 4mg /ml Dexamethasone, and Manual therapy.  All included unless contraindicated   PLAN FOR NEXT SESSION:  Check LTGs and discharge.      Jamey Reas, PT, DPT 03/19/2022, 12:32 PM   PHYSICAL THERAPY DISCHARGE SUMMARY  Visits from Start of Care: 13  Current functional level related to goals / functional outcomes: See note   Remaining deficits: See note   Education / Equipment: HEP  Patient goals were  mostly met . Patient is being discharged due to not returning since the last visit.  Scot Jun, PT, DPT, OCS, ATC 04/24/22  12:04 PM

## 2022-03-21 ENCOUNTER — Encounter: Payer: Managed Care, Other (non HMO) | Admitting: Physical Therapy

## 2022-03-25 ENCOUNTER — Encounter: Payer: Managed Care, Other (non HMO) | Admitting: Physical Therapy

## 2022-03-27 ENCOUNTER — Encounter: Payer: Managed Care, Other (non HMO) | Admitting: Physical Therapy

## 2022-08-15 ENCOUNTER — Other Ambulatory Visit: Payer: Self-pay | Admitting: Family Medicine

## 2022-08-15 ENCOUNTER — Ambulatory Visit: Payer: Managed Care, Other (non HMO)

## 2022-08-15 DIAGNOSIS — Z0000 Encounter for general adult medical examination without abnormal findings: Secondary | ICD-10-CM

## 2022-08-20 ENCOUNTER — Ambulatory Visit
Admission: RE | Admit: 2022-08-20 | Discharge: 2022-08-20 | Disposition: A | Discharge status: Home / Self Care | Payer: Managed Care, Other (non HMO) | Source: Ambulatory Visit | Attending: Family Medicine | Admitting: Family Medicine

## 2022-08-20 DIAGNOSIS — Z0000 Encounter for general adult medical examination without abnormal findings: Secondary | ICD-10-CM

## 2022-08-23 ENCOUNTER — Other Ambulatory Visit: Payer: Self-pay | Admitting: Family Medicine

## 2022-08-23 DIAGNOSIS — R928 Other abnormal and inconclusive findings on diagnostic imaging of breast: Secondary | ICD-10-CM

## 2022-08-30 ENCOUNTER — Ambulatory Visit
Admission: RE | Admit: 2022-08-30 | Discharge: 2022-08-30 | Disposition: A | Discharge status: Home / Self Care | Payer: Managed Care, Other (non HMO) | Source: Ambulatory Visit | Attending: Family Medicine | Admitting: Family Medicine

## 2022-08-30 DIAGNOSIS — R928 Other abnormal and inconclusive findings on diagnostic imaging of breast: Secondary | ICD-10-CM

## 2023-07-27 IMAGING — MG MM DIGITAL SCREENING BILAT W/ TOMO AND CAD
8 series · 9 of 24 positions shown · non-contrast
Comparison: Previous exam(s).

CLINICAL DATA: Screening.

EXAM:
DIGITAL SCREENING BILATERAL MAMMOGRAM WITH TOMOSYNTHESIS AND CAD
TECHNIQUE: Bilateral screening digital craniocaudal and mediolateral oblique
mammograms were obtained. Bilateral screening digital breast
tomosynthesis was performed. The images were evaluated with
computer-aided detection.

[R CC synth-2D]
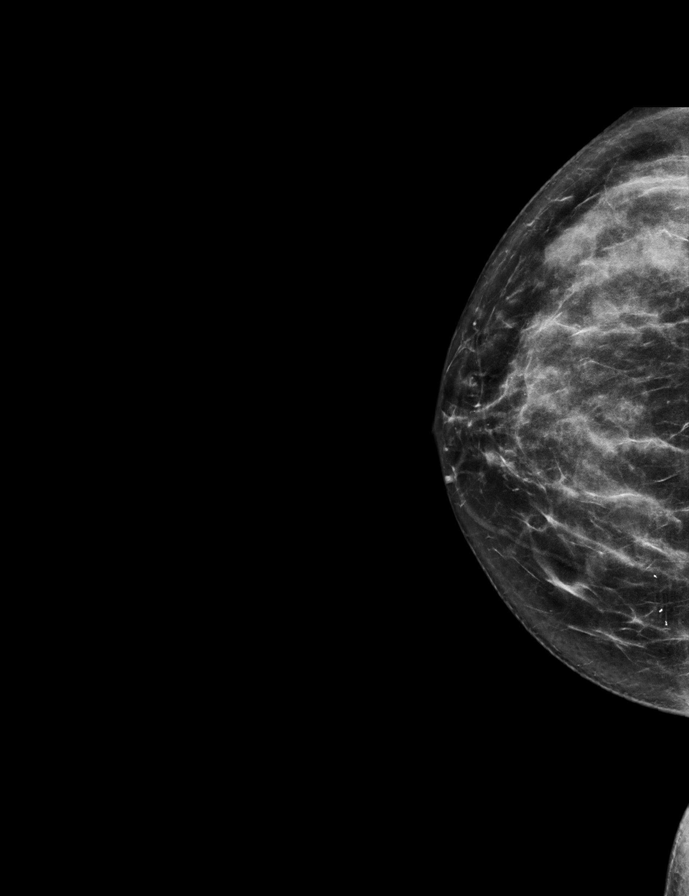

[L MLO synth-2D]
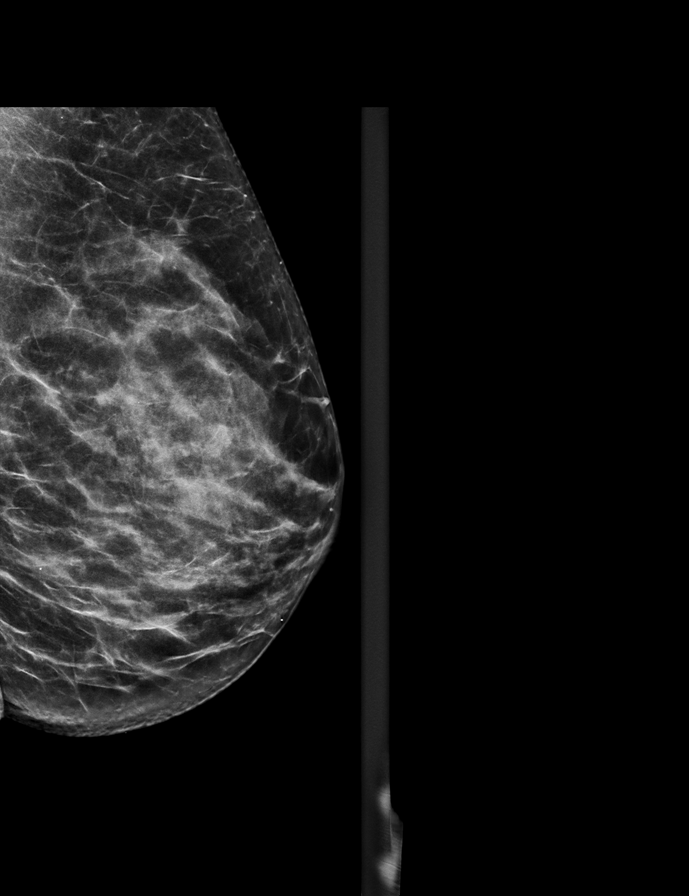

[R MLO synth-2D]
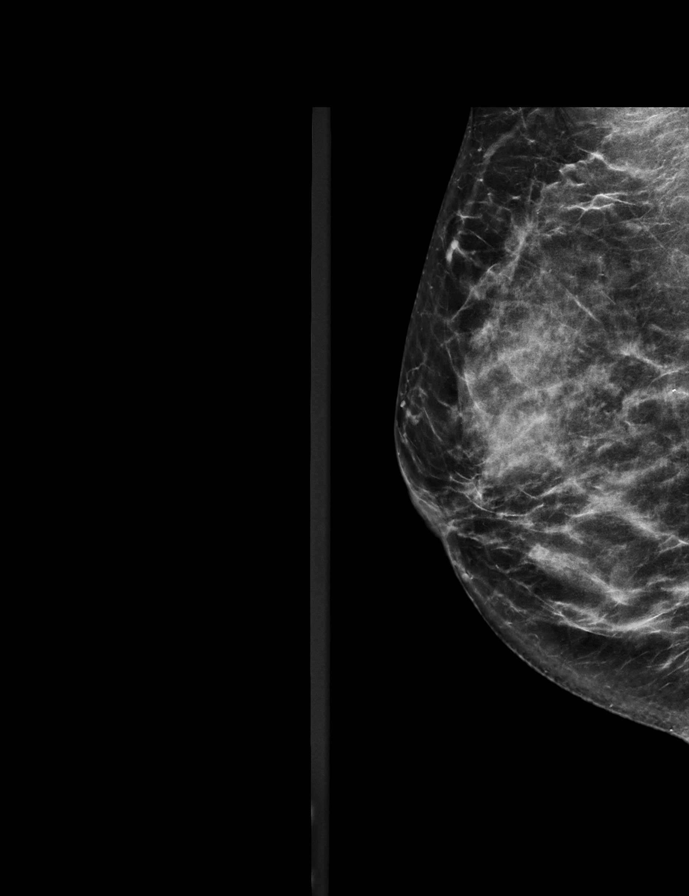

[L CC synth-2D]
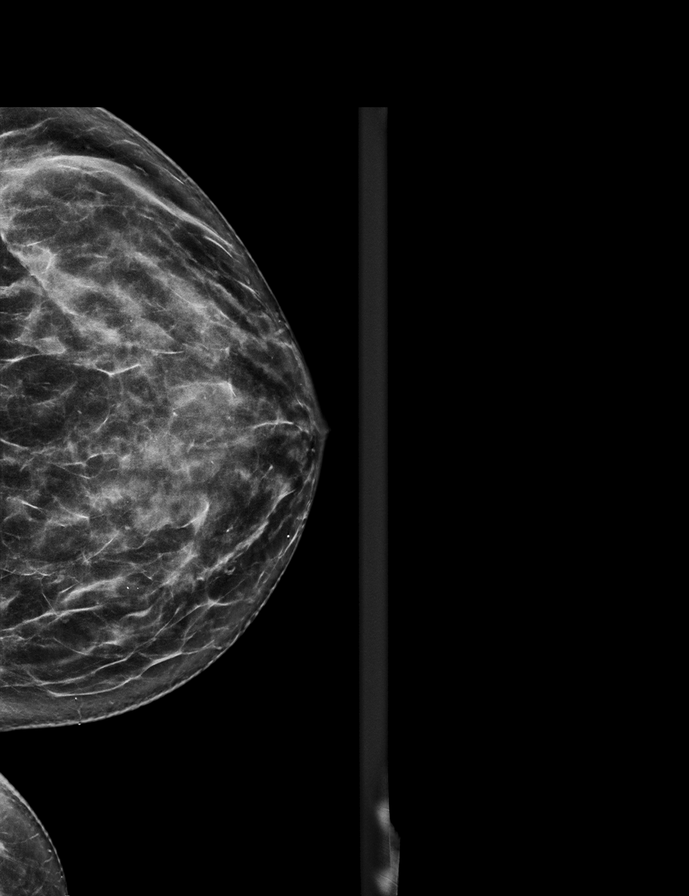

[R CC tomo · 2 of 65 frames shown]
[frame 21/65]
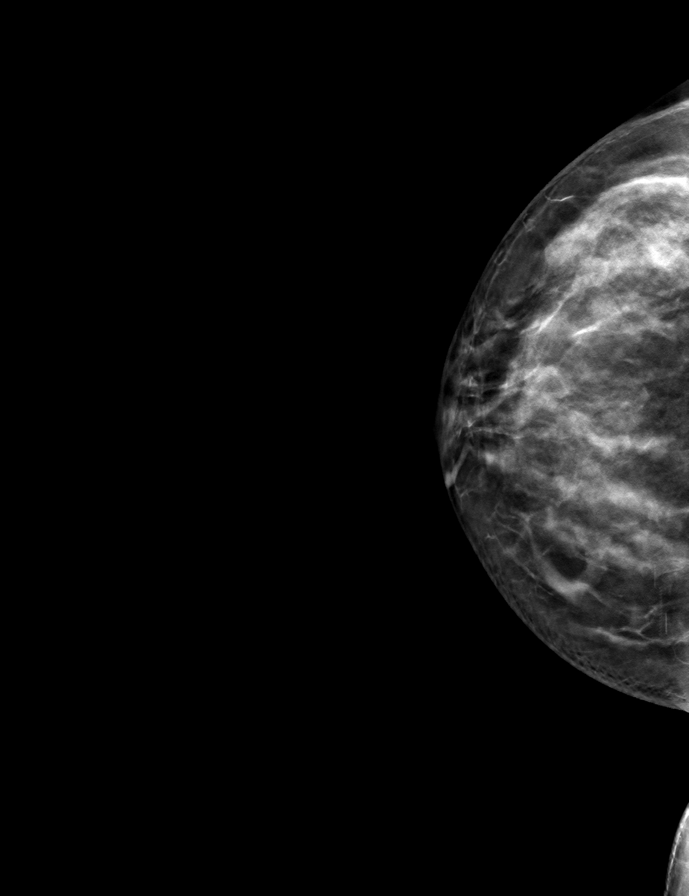
[frame 33/65]
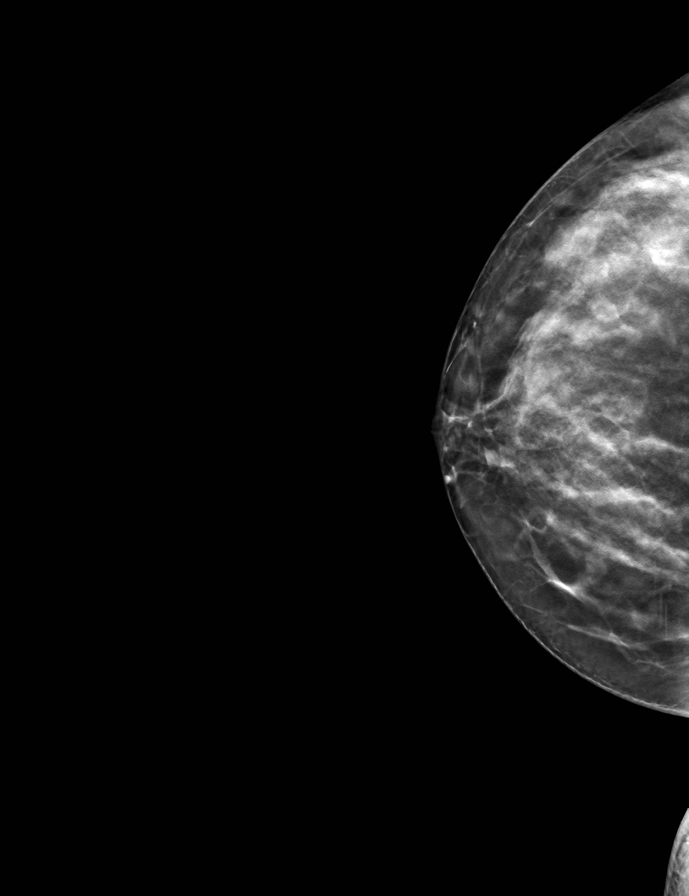

[L MLO tomo · tomo slice 34/67.0]
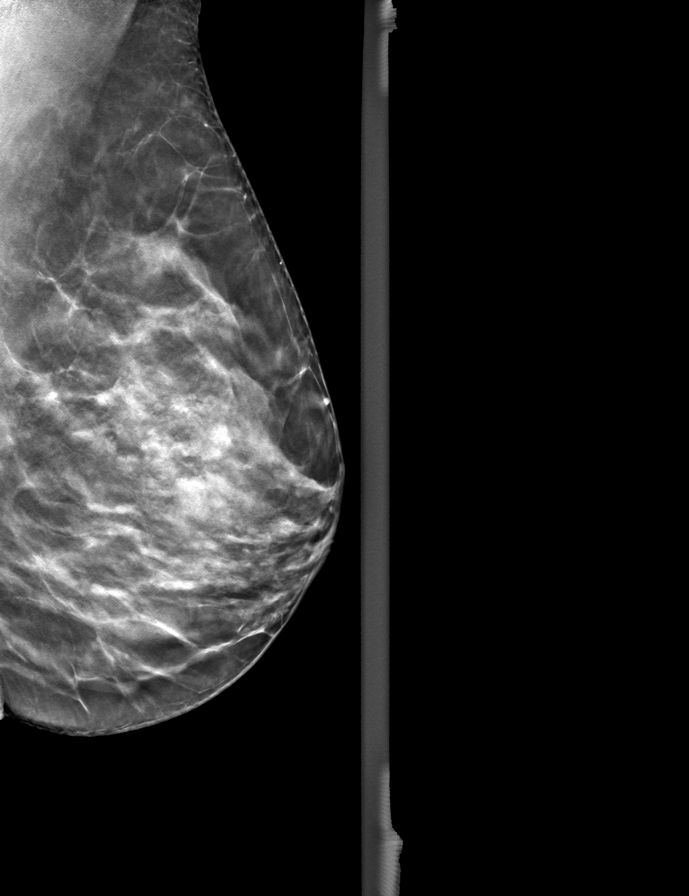

[R MLO tomo · tomo slice 32/63.0]
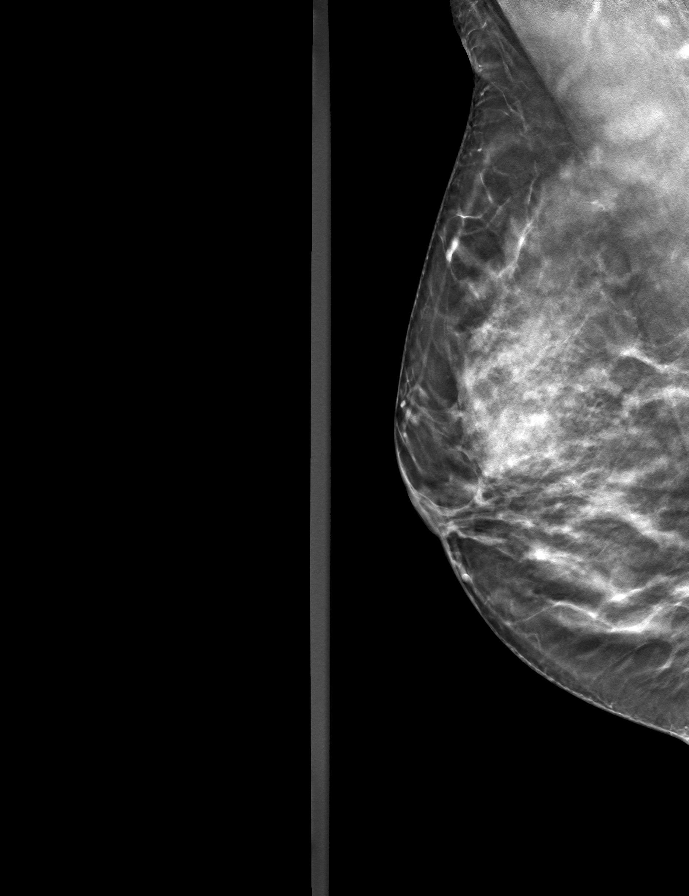

[L CC tomo · tomo slice 33/66.0]
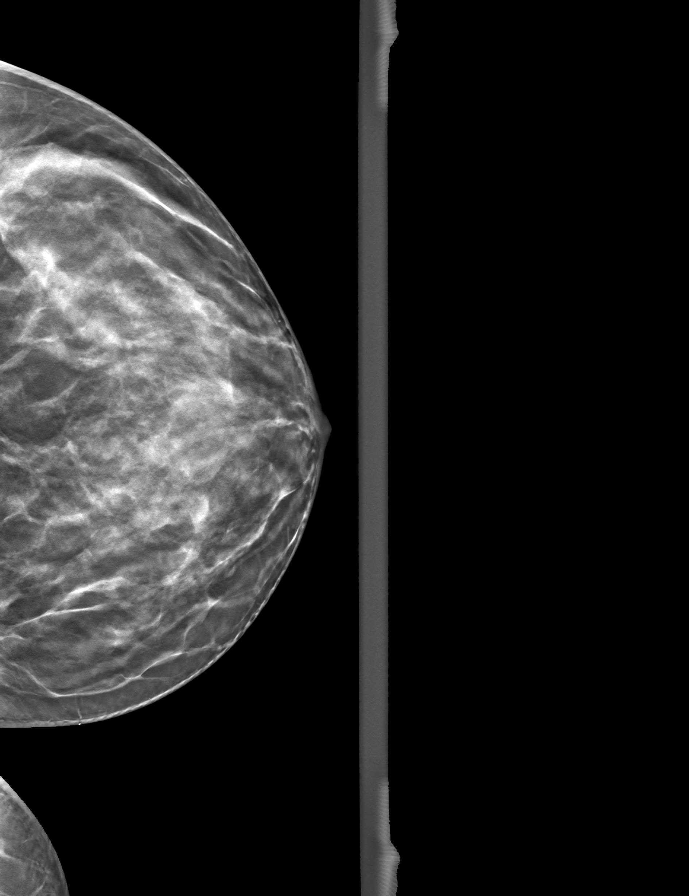

[9 of 24 positions shown; findings below may reference images not displayed]

ACR Breast Density Category c: The breast tissue is heterogeneously
dense, which may obscure small masses.
FINDINGS: There are no findings suspicious for malignancy.
IMPRESSION: No mammographic evidence of malignancy. A result letter of this
screening mammogram will be mailed directly to the patient.

RECOMMENDATION:
Screening mammogram in one year. (Code:[id number])

BI-RADS CATEGORY  1: Negative.

## 2023-11-03 ENCOUNTER — Other Ambulatory Visit: Payer: Self-pay | Admitting: Family Medicine

## 2023-11-03 ENCOUNTER — Other Ambulatory Visit: Payer: Self-pay | Admitting: Obstetrics and Gynecology

## 2023-11-03 ENCOUNTER — Other Ambulatory Visit (HOSPITAL_COMMUNITY)
Admission: RE | Admit: 2023-11-03 | Discharge: 2023-11-03 | Disposition: A | Discharge status: Home / Self Care | Source: Ambulatory Visit | Attending: Obstetrics and Gynecology | Admitting: Obstetrics and Gynecology

## 2023-11-03 DIAGNOSIS — Z1231 Encounter for screening mammogram for malignant neoplasm of breast: Secondary | ICD-10-CM

## 2023-11-03 DIAGNOSIS — Z01419 Encounter for gynecological examination (general) (routine) without abnormal findings: Secondary | ICD-10-CM | POA: Diagnosis present

## 2023-11-04 LAB — CYTOLOGY - PAP
Adequacy: ABSENT
Comment: NEGATIVE
Diagnosis: NEGATIVE
High risk HPV: NEGATIVE

## 2023-11-28 ENCOUNTER — Ambulatory Visit
Admission: RE | Admit: 2023-11-28 | Discharge: 2023-11-28 | Disposition: A | Discharge status: Home / Self Care | Source: Ambulatory Visit | Attending: Family Medicine | Admitting: Family Medicine

## 2023-11-28 DIAGNOSIS — Z1231 Encounter for screening mammogram for malignant neoplasm of breast: Secondary | ICD-10-CM

## 2024-02-25 ENCOUNTER — Ambulatory Visit (HOSPITAL_BASED_OUTPATIENT_CLINIC_OR_DEPARTMENT_OTHER): Admitting: Cardiovascular Disease

## 2024-02-25 ENCOUNTER — Encounter (HOSPITAL_BASED_OUTPATIENT_CLINIC_OR_DEPARTMENT_OTHER): Payer: Self-pay | Admitting: Cardiovascular Disease

## 2024-02-25 VITALS — BP 126/86 | HR 64 | Ht 62.0 in | Wt 130.5 lb

## 2024-02-25 DIAGNOSIS — E7800 Pure hypercholesterolemia, unspecified: Secondary | ICD-10-CM | POA: Insufficient documentation

## 2024-02-25 DIAGNOSIS — Z8249 Family history of ischemic heart disease and other diseases of the circulatory system: Secondary | ICD-10-CM | POA: Insufficient documentation

## 2024-02-25 DIAGNOSIS — I10 Essential (primary) hypertension: Secondary | ICD-10-CM | POA: Insufficient documentation

## 2024-02-25 NOTE — Patient Instructions (Signed)
 Medication Instructions:  Your physician recommends that you continue on your current medications as directed. Please refer to the Current Medication list given to you today.   *If you need a refill on your cardiac medications before your next appointment, please call your pharmacy*  Lab Work: MICROALBUMIN/CREATININE URINE, TSH, AND LPa TODAY   If you have labs (blood work) drawn today and your tests are completely normal, you will receive your results only by: MyChart Message (if you have MyChart) OR A paper copy in the mail If you have any lab test that is abnormal or we need to change your treatment, we will call you to review the results.  Testing/Procedures: Your physician has recommend you to have a coronary calcium score. This is a self pay test that will cost $99  Follow-Up: At [facility], you and your health needs are our priority.  As part of our continuing mission to provide you with exceptional heart care, our providers are all part of one team.  This team includes your primary Cardiologist (physician) and Advanced Practice Providers or APPs (Physician Assistants and Nurse Practitioners) who all work together to provide you with the care you need, when you need it.  Your next appointment:   3 TO 4 month(s)  Provider:   Annabella Scarce, MD    We recommend signing up for the patient portal called MyChart.  Sign up information is provided on this After Visit Summary.  MyChart is used to connect with patients for Virtual Visits (Telemedicine).  Patients are able to view lab/test results, encounter notes, upcoming appointments, etc.  Non-urgent messages can be sent to your provider as well.   To learn more about what you can do with MyChart, go to [email or web address].   Other Instructions Exercise recommendations: The American Heart Association recommends 150 minutes of moderate intensity exercise weekly. Try 30 minutes of moderate intensity exercise 4-5 times  per week. This could include walking, jogging, or swimming.

## 2024-02-25 NOTE — Progress Notes (Signed)
 " Cardiology Office Note:  .   Date:  02/25/2024  ID:  Elizabeth Perry, DOB May 13, 1970, MRN [id number] PCP: Okey Carlin Redbird, MD  [facility] Providers Cardiologist:  None     History of Present Illness: .    Elizabeth Perry is a 54 y.o. female with hypertension, asthma, and family history of cardiovascular disease here for cardiovascular risk assessment.  Discussed the use of AI scribe software for clinical note transcription with the patient, who gave verbal consent to proceed.  History of Present Illness Elizabeth Perry is a 54 year old female with hypertension who presents for a cardiovascular risk assessment due to family history of heart disease. She was referred by Dr. Okey for evaluation of cardiovascular risk due to family history of heart disease.  She has a significant family history of heart disease, with her 26 year old sister having required stent placement for arterial blockage over the past three to four years. She was about age 27 when her symptoms began.  Ms. Rebman has not experienced any heart-related issues herself but is proactive given her family history.  Her hypertension is managed with amlodipine for about four years, with home blood pressure readings typically ranging between 128-130/70-80 mmHg. She was also prescribed metoprolol. No palpitations, heart racing, or issues with her heart rate.  Her family history includes diabetes and high blood pressure in both parents, cholesterol issues in her mother, and stroke in both grandmothers. No family history of dialysis.  Socially, she quit smoking approximately 27-28 years ago after a brief period of social smoking and does not consume alcohol. 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  Nutritionally, she eats out more frequently since her husband's  passing five years ago, although she tries to maintain a balanced diet with fruits and vegetables. She checks her blood pressure at home when she feels it might be elevated.  In terms of her past medical history, she had one miscarriage and all her pregnancies were early, with no issues of high blood pressure during pregnancy. She experienced a broken ankle two years ago, which occasionally swells but is otherwise not problematic.  ROS:  As per HPI  Studies Reviewed: SABRA   EKG Interpretation Date/Time:  Wednesday February 25 2024 09:33:27 EST Ventricular Rate:  64 PR Interval:  134 QRS Duration:  62 QT Interval:  372 QTC Calculation: 383 R Axis:   -31  Text Interpretation: Normal sinus rhythm Left axis deviation ECG not diagnostic for Acute Coronary Syndrome; consider clinical findings Septal infarct , age undetermined No significant change since last tracing Confirmed by Raford Riggs ([id number]) on 02/25/2024 9:55:45 AM   11/17/2023: Total cholesterol 219, triglycerides 90, HDL 67, LDL 137 Sodium 141, potassium 4.8, BUN 12, creatinine 0.95 AST 22, ALT 11   Risk Assessment/Calculations:             Physical Exam:   VS:  BP 126/86 (BP Location: Left Arm, Patient Position: Sitting, Cuff Size: Normal)   Pulse 64   Ht 5' 2 (1.575 m)   Wt 130 lb 8 oz (59.2 kg)   BMI 23.87 kg/m  , BMI Body mass index is 23.87 kg/m. GENERAL:  Well appearing HEENT: Pupils equal round and reactive, fundi not visualized, oral mucosa unremarkable NECK:  No jugular venous distention, waveform within normal limits, carotid upstroke brisk and symmetric, no bruits, no thyromegaly  LUNGS:  Clear to auscultation bilaterally HEART:  RRR.  PMI not displaced or sustained,S1 and S2 within normal limits, no S3, no S4, no clicks, no rubs, no murmurs ABD:  Flat, positive bowel sounds normal in frequency in pitch, no bruits, no rebound, no guarding, no midline pulsatile mass, no hepatomegaly, no splenomegaly EXT:  2  plus pulses throughout, no edema, no cyanosis no clubbing SKIN:  No rashes no nodules NEURO:  Cranial nerves II through XII grossly intact, motor grossly intact throughout PSYCH:  Cognitively intact, oriented to person place and time  ASSESSMENT AND PLAN: .    Assessment & Plan # Primary hypertension Blood pressure controlled with amlodipine. Metoprolol discontinued due to minimal impact. Emphasized lifestyle changes to reduce medication reliance. - Increased amlodipine to 5 mg daily. - Discontinued metoprolol. - Encouraged 150 minutes of exercise per week. - Advised on dietary modifications to reduce salt intake. - Ordered urine albumin to creatinine ratio. - Ordered TSH test. - Ordered lipid panel.  # Hyperlipidemia # Family history of premature CAD:  LDL slightly elevated. Recommended lifestyle changes and coronary calcium score to assess cardiovascular risk. - Ordered coronary calcium score. - Encouraged dietary modifications to manage cholesterol. - Encouraged regular exercise. - Ordered LP(a) test.      Dispo: f/u 3-4 months  Signed, Annabella Scarce, MD   "

## 2024-02-27 LAB — MICROALBUMIN / CREATININE URINE RATIO
Creatinine, Urine: 83.4 mg/dL
Microalb/Creat Ratio: <4 mg/g{creat} (ref 0–29)
Microalbumin, Urine: <3 ug/mL

## 2024-02-27 LAB — TSH: TSH: 2.14 u[IU]/mL (ref 0.450–4.500)

## 2024-02-27 LAB — LIPOPROTEIN A (LPA): Lipoprotein (a): 174.1 nmol/L — ABNORMAL HIGH

## 2024-02-28 ENCOUNTER — Ambulatory Visit: Payer: Self-pay | Admitting: Cardiovascular Disease

## 2024-03-03 ENCOUNTER — Ambulatory Visit (HOSPITAL_BASED_OUTPATIENT_CLINIC_OR_DEPARTMENT_OTHER)
Admission: RE | Admit: 2024-03-03 | Discharge: 2024-03-03 | Disposition: A | Payer: Self-pay | Source: Ambulatory Visit | Attending: Cardiovascular Disease

## 2024-03-03 DIAGNOSIS — I10 Essential (primary) hypertension: Secondary | ICD-10-CM

## 2024-03-03 DIAGNOSIS — Z8249 Family history of ischemic heart disease and other diseases of the circulatory system: Secondary | ICD-10-CM

## 2024-03-04 ENCOUNTER — Telehealth: Payer: Self-pay | Admitting: Cardiovascular Disease

## 2024-03-04 MED ORDER — AMLODIPINE BESYLATE 5 MG PO TABS: 5.0000 mg | ORAL_TABLET | Freq: Every day | ORAL | 11 refills | Status: AC

## 2024-03-04 NOTE — Telephone Encounter (Signed)
" °  Per Dr. Skeeter note from 02/25/24  Assessment & Plan # Primary hypertension Blood pressure controlled with amlodipine . Metoprolol discontinued due to minimal impact. Emphasized lifestyle changes to reduce medication reliance. - Increased amlodipine  to 5 mg daily. - Discontinued metoprolol. - Encouraged 150 minutes of exercise per week. - Advised on dietary modifications to reduce salt intake.  In speaking with patient the 5mg  of Norvasc  was not called in.  I sent to her requested pharmacy for a 30 day supply (her request due to insurance) with 11 refills.  She is aware to discontinue the Metoprolol.   "

## 2024-03-04 NOTE — Telephone Encounter (Signed)
 Pt c/o medication issue:  1. Name of Medication: metoprolol succinate (TOPROL-XL) 25 MG 24 hr tablet  amLODipine  (NORVASC ) 2.5 MG tablet  2. How are you currently taking this medication (dosage and times per day)? NA  3. Are you having a reaction (difficulty breathing--STAT)? no  4. What is your medication issue? Pt would like a c/b regarding above medications. Pt is wanting to know if she be taking them both or stop taking metoprolol succinate (TOPROL-XL) 25 MG 24 hr tablet. Please advise

## 2024-06-07 ENCOUNTER — Ambulatory Visit (HOSPITAL_BASED_OUTPATIENT_CLINIC_OR_DEPARTMENT_OTHER): Admitting: Cardiovascular Disease
